# Patient Record
Sex: Female | Born: 2000 | Race: Black or African American | Hispanic: No | Marital: Single | Smoking: Never smoker
Health system: Southern US, Community
[De-identification: ages and names within clinical notes are randomized; demographics above are authoritative.]

## PROBLEM LIST (undated history)

## (undated) DIAGNOSIS — T7840XA Allergy, unspecified, initial encounter: Secondary | ICD-10-CM

## (undated) DIAGNOSIS — M25562 Pain in left knee: Secondary | ICD-10-CM

## (undated) DIAGNOSIS — H7290 Unspecified perforation of tympanic membrane, unspecified ear: Secondary | ICD-10-CM

## (undated) DIAGNOSIS — L309 Dermatitis, unspecified: Secondary | ICD-10-CM

## (undated) HISTORY — PX: TYMPANOSTOMY TUBE PLACEMENT: SHX32

## (undated) HISTORY — DX: Dermatitis, unspecified: L30.9

## (undated) HISTORY — PX: KNEE SURGERY: SHX244

---
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
## 2019-05-26 ENCOUNTER — Other Ambulatory Visit: Payer: Self-pay

## 2019-05-26 ENCOUNTER — Encounter (HOSPITAL_COMMUNITY): Payer: Self-pay | Admitting: Emergency Medicine

## 2019-05-26 ENCOUNTER — Emergency Department (HOSPITAL_COMMUNITY)
Admission: EM | Admit: 2019-05-26 | Discharge: 2019-05-26 | Disposition: A | Discharge status: Home / Self Care | Payer: BLUE CROSS/BLUE SHIELD | Attending: Emergency Medicine | Admitting: Emergency Medicine

## 2019-05-26 DIAGNOSIS — Z79899 Other long term (current) drug therapy: Secondary | ICD-10-CM | POA: Insufficient documentation

## 2019-05-26 DIAGNOSIS — B9689 Other specified bacterial agents as the cause of diseases classified elsewhere: Secondary | ICD-10-CM | POA: Insufficient documentation

## 2019-05-26 DIAGNOSIS — N760 Acute vaginitis: Secondary | ICD-10-CM

## 2019-05-26 LAB — WET PREP, GENITAL
Sperm: NONE SEEN
Trich, Wet Prep: NONE SEEN
Yeast Wet Prep HPF POC: NONE SEEN

## 2019-05-26 LAB — URINALYSIS, ROUTINE W REFLEX MICROSCOPIC
Bilirubin Urine: NEGATIVE
Glucose, UA: NEGATIVE mg/dL
Hgb urine dipstick: NEGATIVE
Ketones, ur: NEGATIVE mg/dL
Nitrite: NEGATIVE
Protein, ur: NEGATIVE mg/dL
Specific Gravity, Urine: 1.018 (ref 1.005–1.030)
pH: 6 (ref 5.0–8.0)

## 2019-05-26 LAB — POC URINE PREG, ED: Preg Test, Ur: NEGATIVE

## 2019-05-26 MED ORDER — ONDANSETRON HCL 4 MG PO TABS
4.0000 mg | ORAL_TABLET | Freq: Once | ORAL | Status: AC
  Administered 2019-05-26: 4 mg via ORAL
  Filled 2019-05-26: qty 1

## 2019-05-26 MED ORDER — METRONIDAZOLE 500 MG PO TABS
500.0000 mg | ORAL_TABLET | Freq: Once | ORAL | Status: AC
  Administered 2019-05-26: 500 mg via ORAL
  Filled 2019-05-26: qty 1

## 2019-05-26 MED ORDER — METRONIDAZOLE 500 MG PO TABS: 500.0000 mg | ORAL_TABLET | Freq: Two times a day (BID) | ORAL | 0 refills | Status: DC

## 2019-05-26 NOTE — ED Provider Notes (Signed)
[facility] Provider [facility]   CSN: [id number] Arrival date & time: 05/26/19  1054     History   Chief Complaint Chief Complaint  Patient presents with  . Vaginal Discharge    HPI Janet lexius Marnette BurgessJ Merle is a 18 y.o. female.     The history is provided by the patient.  Vaginal Discharge Quality:  Yellow, thin and green Severity:  Moderate Onset quality:  Gradual Duration:  5 days Timing:  Intermittent Progression:  Worsening Chronicity:  New Context: at rest   Relieved by:  Nothing Worsened by:  Nothing Ineffective treatments:  None tried Associated symptoms: vaginal itching   Associated symptoms: no abdominal pain, no dysuria, no fever, no nausea and no vomiting   Risk factors: unprotected sex   Risk factors: no foreign body     Past Medical History:  Diagnosis Date  . Allergy   . Left knee pain   . 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 Pain in joint, lower leg 07/06/2012  . Stiffness of joint, not elsewhere classified, lower leg 07/06/2012  . Abnormality of gait 07/06/2012  . Patellar dislocation 07/06/2012    Past Surgical History:  Procedure Laterality Date  . KNEE SURGERY Left   . TYMPANOPLASTY  11/21/2012   Procedure: TYMPANOPLASTY;  Surgeon: Darletta MollSui W Teoh, MD;  Location: [facility];  Service: ENT;  Laterality: Right;  with fascia graft   . TYMPANOSTOMY TUBE PLACEMENT  age 89     OB History    Gravida      Para      Term      Preterm      AB      Living  0     SAB      TAB      Ectopic      Multiple      Live Births               Home Medications    Prior to Admission medications   Medication Sig Start Date End Date Taking? Authorizing Provider  fexofenadine (ALLEGRA) 180 MG tablet Take 180 mg by mouth daily.   Yes [provider]  metroNIDAZOLE (FLAGYL) 500 MG tablet Take 1 tablet (500 mg total) by mouth 2 (two) times  daily. Patient not taking: Reported on 05/26/2019 05/11/18   Lynch AmorIdol, Julie, PA-C  promethazine (PHENERGAN) 25 MG tablet Take 1 tablet (25 mg total) by mouth every 8 (eight) hours as needed for nausea or vomiting (headache). Patient not taking: Reported on 05/26/2019 09/15/17   Samuel JesterMcManus, Kathleen, DO    Family History Family History  Problem Relation Age of Onset  . Cancer Other   . Heart disease Other     Social History Social History   Tobacco Use  . Smoking status: Never Smoker  . Smokeless tobacco: Never Used  Substance Use Topics  . Alcohol use: No  . Drug use: No     Allergies   Patient has no known allergies.   Review of Systems Review of Systems  Constitutional: Negative for activity change, appetite change and fever.  HENT: Negative for congestion, ear discharge, ear pain, facial swelling, nosebleeds, rhinorrhea, sneezing and tinnitus.   Eyes: Negative for photophobia, pain and discharge.  Respiratory: Negative for cough, choking, shortness of breath and wheezing.   Cardiovascular: Negative for chest pain, palpitations and leg swelling.  Gastrointestinal: Negative  for abdominal pain, blood in stool, constipation, diarrhea, nausea and vomiting.  Genitourinary: Positive for vaginal discharge. Negative for difficulty urinating, dysuria, flank pain, frequency and hematuria.  Musculoskeletal: Negative for back pain, gait problem, myalgias and neck pain.  Skin: Negative for color change, rash and wound.  Neurological: Negative for dizziness, seizures, syncope, facial asymmetry, speech difficulty, weakness and numbness.  Hematological: Negative for adenopathy. Does not bruise/bleed easily.  Psychiatric/Behavioral: Negative for agitation, confusion, hallucinations, self-injury and suicidal ideas. The patient is not nervous/anxious.      Physical Exam Updated Vital Signs BP 113/71   Pulse (!) 101   Temp 98.8 F (37.1 C) (Oral)   Resp 18   Ht 5' 5.5" (1.664 m)   Wt 65 kg    LMP 05/03/2019   SpO2 99%   BMI 23.48 kg/m   Physical Exam Vitals signs and nursing note reviewed. Exam conducted with a chaperone present.  Constitutional:      Appearance: She is well-developed. She is not toxic-appearing.  HENT:     Head: Normocephalic.     Right Ear: Tympanic membrane and external ear normal.     Left Ear: Tympanic membrane and external ear normal.  Eyes:     General: Lids are normal.     Pupils: Pupils are equal, round, and reactive to light.  Neck:     Musculoskeletal: Normal range of motion and neck supple.     Vascular: No carotid bruit.  Cardiovascular:     Rate and Rhythm: Normal rate and regular rhythm.     Pulses: Normal pulses.     Heart sounds: Normal heart sounds.  Pulmonary:     Effort: No respiratory distress.     Breath sounds: Normal breath sounds.  Abdominal:     General: Bowel sounds are normal.     Palpations: Abdomen is soft.     Tenderness: There is no abdominal tenderness. There is no guarding.  Genitourinary:    Pubic Area: No rash or pubic lice.      Labia:        Right: No rash, tenderness or lesion.        Left: No rash, tenderness or lesion.      Vagina: No foreign body. Vaginal discharge present. No erythema, tenderness or bleeding.     Cervix: Discharge, friability and erythema present.     Uterus: Not tender and no uterine prolapse.      Adnexa: Right adnexa normal and left adnexa normal.  Musculoskeletal: Normal range of motion.  Lymphadenopathy:     Head:     Right side of head: No submandibular adenopathy.     Left side of head: No submandibular adenopathy.     Cervical: No cervical adenopathy.     Lower Body: No right inguinal adenopathy. No left inguinal adenopathy.  Skin:    General: Skin is warm and dry.  Neurological:     Mental Status: She is alert and oriented to person, place, and time.     Cranial Nerves: No cranial nerve deficit.     Sensory: No sensory deficit.  Psychiatric:        Speech: Speech  normal.      ED Treatments / Results  Labs (all labs ordered are listed, but only abnormal results are displayed) Labs Reviewed  URINALYSIS, ROUTINE W REFLEX MICROSCOPIC - Abnormal; Notable for the following components:      Result Value   APPearance HAZY (*)    Leukocytes,Ua MODERATE (*)  Bacteria, UA RARE (*)    All other components within normal limits  POC URINE PREG, ED    EKG None  Radiology No results found.  Procedures Procedures (including critical care time)  Medications Ordered in ED Medications - No data to display   Initial Impression / Assessment and Plan / ED Course  I have reviewed the triage vital signs and the nursing notes.  Pertinent labs & imaging results that were available during my care of the patient were reviewed by me and considered in my medical decision making (see chart for details).          Final Clinical Impressions(s) / ED Diagnoses MDM  Vital signs are well within normal limits.  Pulse oximetry is 99 to 100% on room air.  Within normal limits by my interpretation.  The patient reports vaginal discharge that seems to be getting worse.  The patient states that she has been practicing unprotected intercourse.  The discharge has become foul-smelling at times.  Patient denies any known foreign body present.  Wet prep shows clue cells present as well as moderate white blood cells.  Urine pregnancy test is negative  Urine analysis shows a hazy yellow specimen with a specific gravity 1.018.  There are moderate leukocyte esterase present, but 0-5 red cells and 0-5 white blood cells.  Patient will be treated with metronidazole.  Have given the patient instructions to make sure she takes this medicine with a meal, and to avoid all alcohol.  I also discussed with the patient the importance of safe sex.  Patient will follow-up with her primary physician or GYN if not improving.   Final diagnoses:  BV (bacterial vaginosis)    ED  Discharge Orders    None       Ivery QualeBryant, Teresa Lemmerman, PA-C 05/26/19 2138    Vanetta MuldersZackowski, Scott, MD 06/03/19 [phone]

## 2019-05-26 NOTE — ED Triage Notes (Signed)
Patient reports foul smelling vaginal discharge x 4-5 days.

## 2019-05-26 NOTE — Discharge Instructions (Addendum)
Your examination and your lab testing are consistent with bacterial vaginitis.  Please use metronidazole 2 times daily with food.  Please do not take this medicine on an empty stomach.  Please refrain from all alcohol, including mouthwash while taking this medication.  Please see Dr. Lanny Cramp for additional evaluation if not improving. A culture for gonorrhea and chlamydia were obtained.  If any of these are positive, someone from our flow managers office will call you with instructions.  For your safety, please practice safe sex.

## 2019-05-28 LAB — RPR: RPR Ser Ql: NONREACTIVE

## 2019-05-30 LAB — GC/CHLAMYDIA PROBE AMP (~~LOC~~) NOT AT ARMC
Chlamydia: POSITIVE — AB
Neisseria Gonorrhea: NEGATIVE

## 2019-05-30 LAB — HIV ANTIBODY (ROUTINE TESTING W REFLEX): HIV Screen 4th Generation wRfx: NONREACTIVE

## 2019-05-31 ENCOUNTER — Telehealth: Payer: Self-pay | Admitting: Medical

## 2019-05-31 DIAGNOSIS — A749 Chlamydial infection, unspecified: Secondary | ICD-10-CM

## 2019-05-31 MED ORDER — AZITHROMYCIN 250 MG PO TABS: 1000.0000 mg | ORAL_TABLET | Freq: Once | ORAL | 0 refills | Status: AC

## 2019-05-31 NOTE — Telephone Encounter (Addendum)
Janet Lynch tested positive for  Chlamydia. Patient was called by RN and allergies and pharmacy confirmed. Rx sent to pharmacy of choice.   Luvenia Redden, PA-C 05/31/2019 11:14 AM      ----- Message from Bjorn Loser, RN sent at 05/31/2019 11:04 AM EDT ----- This patient tested positive for :  chlamydia  She:"has NKDA", I have informed the patient of her results and confirmed her pharmacy is correct in her chart. Please send Rx.   Thank you,   Bjorn Loser, RN   Results faxed to [facility].

## 2019-06-28 DIAGNOSIS — Z113 Encounter for screening for infections with a predominantly sexual mode of transmission: Secondary | ICD-10-CM | POA: Diagnosis not present

## 2019-06-28 DIAGNOSIS — Z01419 Encounter for gynecological examination (general) (routine) without abnormal findings: Secondary | ICD-10-CM | POA: Diagnosis not present

## 2019-06-28 DIAGNOSIS — Z8619 Personal history of other infectious and parasitic diseases: Secondary | ICD-10-CM | POA: Diagnosis not present

## 2019-09-09 ENCOUNTER — Other Ambulatory Visit: Payer: Self-pay

## 2019-09-09 ENCOUNTER — Encounter (HOSPITAL_COMMUNITY): Payer: Self-pay | Admitting: Emergency Medicine

## 2019-09-09 ENCOUNTER — Emergency Department (HOSPITAL_COMMUNITY): Admission: EM | Admit: 2019-09-09 | Discharge: 2019-09-09 | Discharge status: Absent without leave | Payer: Self-pay

## 2019-09-09 ENCOUNTER — Emergency Department (HOSPITAL_COMMUNITY)
Admission: EM | Admit: 2019-09-09 | Discharge: 2019-09-09 | Disposition: A | Discharge status: Home / Self Care | Payer: Self-pay | Attending: Emergency Medicine | Admitting: Emergency Medicine

## 2019-09-09 DIAGNOSIS — N760 Acute vaginitis: Secondary | ICD-10-CM | POA: Diagnosis not present

## 2019-09-09 DIAGNOSIS — Z79899 Other long term (current) drug therapy: Secondary | ICD-10-CM | POA: Insufficient documentation

## 2019-09-09 DIAGNOSIS — N3000 Acute cystitis without hematuria: Secondary | ICD-10-CM | POA: Diagnosis not present

## 2019-09-09 DIAGNOSIS — B9689 Other specified bacterial agents as the cause of diseases classified elsewhere: Secondary | ICD-10-CM | POA: Insufficient documentation

## 2019-09-09 LAB — URINALYSIS, ROUTINE W REFLEX MICROSCOPIC
Bilirubin Urine: NEGATIVE
Glucose, UA: NEGATIVE mg/dL
Hgb urine dipstick: NEGATIVE
Ketones, ur: NEGATIVE mg/dL
Nitrite: NEGATIVE
Protein, ur: NEGATIVE mg/dL
Specific Gravity, Urine: 1.011 (ref 1.005–1.030)
pH: 7 (ref 5.0–8.0)

## 2019-09-09 LAB — WET PREP, GENITAL
Sperm: NONE SEEN
Trich, Wet Prep: NONE SEEN
Yeast Wet Prep HPF POC: NONE SEEN

## 2019-09-09 LAB — PREGNANCY, URINE: Preg Test, Ur: NEGATIVE

## 2019-09-09 MED ORDER — METRONIDAZOLE 0.75 % VA GEL: 1.0000 | Freq: Every day | VAGINAL | 0 refills | Status: AC

## 2019-09-09 MED ORDER — CEPHALEXIN 500 MG PO CAPS: 500.0000 mg | ORAL_CAPSULE | Freq: Two times a day (BID) | ORAL | 0 refills | Status: DC

## 2019-09-09 NOTE — ED Triage Notes (Signed)
Patient complains of vaginal pain and swelling 5 days.

## 2019-09-09 NOTE — Discharge Instructions (Signed)
You were seen in the emergency department today due to vaginal irritation and urinary symptoms.  Your urine looks somewhat infected which we are treating with Keflex, an oral antibiotic.  Your wet prep showed bacterial vaginosis, please see attached handout, we are treating this with intravaginal metronidazole.  We have prescribed you new medication(s) today. Discuss the medications prescribed today with your pharmacist as they can have adverse effects and interactions with your other medicines including over the counter and prescribed medications. Seek medical evaluation if you start to experience new or abnormal symptoms after taking one of these medicines, seek care immediately if you start to experience difficulty breathing, feeling of your throat closing, facial swelling, or rash as these could be indications of a more serious allergic reaction  Please take all medicines as prescribed.  You tested you for gonorrhea, chlamydia, HIV, and syphilis, if any of these results are positive we will call you, if positive you will need to seek treatment and inform all sexual partners.  Please follow-up with OB/GYN within 1 week.  Return to the ER for new or worsening symptoms including but not limited to worsening pain, fever, vomiting, pelvic pain, or any other concerns.

## 2019-09-09 NOTE — ED Provider Notes (Signed)
[facility] EMERGENCY DEPARTMENT Provider Note   CSN: [id number] Arrival date & time: 09/09/19  1802     History   Chief Complaint Chief Complaint  Patient presents with  . Vaginal Pain    HPI Janet Lynch is a 18 y.o. female who presents to the ED w/ complaints of vaginal irritation & urinary sxs for past 3-4 days. Patient states she has pain/swelling & the vaginal introitus that seems to be progressively worsening w/o alleviating/aggravating factors. She has also had some mild dysuria & increased urinary frequency. Started to note discharge today that is white and nonpruritic. Denies fever, chills, N/V, pelvic pain, flank pain, or vaginal bleeding. She is currently sexually active w/ 1 female partner w/o protection.      HPI  Past Medical History:  Diagnosis Date  . Allergy   . Left knee pain   . 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 Pain in joint, lower leg 07/06/2012  . Stiffness of joint, not elsewhere classified, lower leg 07/06/2012  . Abnormality of gait 07/06/2012  . Patellar dislocation 07/06/2012    Past Surgical History:  Procedure Laterality Date  . KNEE SURGERY Left   . TYMPANOPLASTY  11/21/2012   Procedure: TYMPANOPLASTY;  Surgeon: Darletta Moll, MD;  Location: [facility];  Service: ENT;  Laterality: Right;  with fascia graft   . TYMPANOSTOMY TUBE PLACEMENT  age 52     OB History    Gravida      Para      Term      Preterm      AB      Living  0     SAB      TAB      Ectopic      Multiple      Live Births               Home Medications    Prior to Admission medications   Medication Sig Start Date End Date Taking? Authorizing Provider  fexofenadine (ALLEGRA) 180 MG tablet Take 180 mg by mouth daily.    [provider]  metroNIDAZOLE (FLAGYL) 500 MG tablet Take 1 tablet (500 mg total) by mouth 2 (two) times daily. Please take with food  05/26/19   Ivery Quale, PA-C  promethazine (PHENERGAN) 25 MG tablet Take 1 tablet (25 mg total) by mouth every 8 (eight) hours as needed for nausea or vomiting (headache). Patient not taking: Reported on 05/26/2019 09/15/17   Samuel Jester, DO    Family History Family History  Problem Relation Age of Onset  . Cancer Other   . Heart disease Other     Social History Social History   Tobacco Use  . Smoking status: Never Smoker  . Smokeless tobacco: Never Used  Substance Use Topics  . Alcohol use: No  . Drug use: No     Allergies   Patient has no known allergies.   Review of Systems Review of Systems  Constitutional: Negative for chills and fever.  Respiratory: Negative for shortness of breath.   Cardiovascular: Negative for chest pain.  Gastrointestinal: Negative for abdominal pain, nausea and vomiting.  Genitourinary: Positive for dysuria, frequency, vaginal discharge and vaginal pain. Negative for flank pain, hematuria, pelvic pain and vaginal bleeding.  All other systems reviewed and are negative.    Physical Exam Updated Vital Signs BP 109/73 (BP Location: Right  Arm)   Pulse 99   Temp 98.5 F (36.9 C) (Oral)   Resp 18   Ht 5\' 5"  (1.651 m)   Wt 55.8 kg   LMP 08/28/2019   SpO2 100%   BMI 20.47 kg/m   Physical Exam Vitals signs and nursing note reviewed. Exam conducted with a chaperone present.  Constitutional:      General: She is not in acute distress.    Appearance: She is well-developed. She is not toxic-appearing.  HENT:     Head: Normocephalic and atraumatic.  Eyes:     General:        Right eye: No discharge.        Left eye: No discharge.     Conjunctiva/sclera: Conjunctivae normal.  Neck:     Musculoskeletal: Neck supple.  Cardiovascular:     Rate and Rhythm: Normal rate and regular rhythm.  Pulmonary:     Effort: Pulmonary effort is normal. No respiratory distress.     Breath sounds: Normal breath sounds. No wheezing, rhonchi or rales.   Abdominal:     General: There is no distension.     Palpations: Abdomen is soft.     Tenderness: There is no abdominal tenderness. There is no right CVA tenderness, left CVA tenderness, guarding or rebound.  Genitourinary:    Exam position: Supine.     Labia:        Right: No lesion.        Left: No lesion.      Vagina: Vaginal discharge (thick discharge in the vaginal vault- moderate amount) present.     Cervix: No cervical motion tenderness or friability.     Adnexa:        Right: No tenderness.         Left: No tenderness.       Comments: RN Lonn Georgia present as chaperone. Erythema @ the vaginal introitus.   No wounds or lesions.  No abscess.  Skin:    General: Skin is warm and dry.     Findings: No rash.  Neurological:     Mental Status: She is alert.     Comments: Clear speech.   Psychiatric:        Behavior: Behavior normal.      ED Treatments / Results  Labs (all labs ordered are listed, but only abnormal results are displayed) Labs Reviewed  WET PREP, GENITAL - Abnormal; Notable for the following components:      Result Value   Clue Cells Wet Prep HPF POC PRESENT (*)    WBC, Wet Prep HPF POC MANY (*)    All other components within normal limits  URINALYSIS, ROUTINE W REFLEX MICROSCOPIC - Abnormal; Notable for the following components:   APPearance CLOUDY (*)    Leukocytes,Ua LARGE (*)    Bacteria, UA RARE (*)    All other components within normal limits  URINE CULTURE  PREGNANCY, URINE  RPR  HIV ANTIBODY (ROUTINE TESTING W REFLEX)  HIV4GL SAVE TUBE  GC/CHLAMYDIA PROBE AMP ([place]) NOT AT [facility]    EKG None  Radiology No results found.  Procedures Procedures (including critical care time)  Medications Ordered in ED Medications - No data to display   Initial Impression / Assessment and Plan / ED Course  I have reviewed the triage vital signs and the nursing notes.  Pertinent labs & imaging results that were available during my care of the  patient were reviewed by me and considered in my medical decision  making (see chart for details).   Patient presents w/ vaginal/urinary complaints.  Nontoxic appearing, no apparent distress, vitals WNL.  No abdominal, adnexal, or cervical motion tenderness- does not seem consistent w/ PID. STD testing including RPR/HIV/GC/chlamdydia- pending results patient aware if positive will need tx and to inform all sexual partners.  Wet prep w/ BV- will treat with intra-vaginal flagyl, no yeast or trich. UA w/ concern for infection given large leukocytes but this is contaminated w/ squamous epithelial cells, given her urinary sxs will tx w/ keflex & send for culture. I discussed results, treatment plan, need for follow-up, and return precautions with the patient. Provided opportunity for questions, patient confirmed understanding and is in agreement with plan.     Final Clinical Impressions(s) / ED Diagnoses   Final diagnoses:  Bacterial vaginosis  Acute cystitis without hematuria    ED Discharge Orders         Ordered    cephALEXin (KEFLEX) 500 MG capsule  2 times daily     09/09/19 2022    metroNIDAZOLE (METROGEL VAGINAL) 0.75 % vaginal gel  Daily at bedtime     09/09/19 2022           Cherly Andersonetrucelli, Keir Foland R, PA-C 09/09/19 2025    Vanetta MuldersZackowski, Scott, MD 09/19/19 [id number]

## 2019-09-10 LAB — HIV ANTIBODY (ROUTINE TESTING W REFLEX): HIV Screen 4th Generation wRfx: NONREACTIVE

## 2019-09-10 LAB — RPR: RPR Ser Ql: NONREACTIVE

## 2019-09-11 LAB — URINE CULTURE: Culture: <10000 — AB

## 2019-09-12 LAB — GC/CHLAMYDIA PROBE AMP (~~LOC~~) NOT AT ARMC
Chlamydia: NEGATIVE
Neisseria Gonorrhea: NEGATIVE

## 2019-09-18 ENCOUNTER — Telehealth: Payer: Self-pay | Admitting: *Deleted

## 2019-09-18 NOTE — Telephone Encounter (Signed)
Returned patient's call.  Informed that since she has never been seen in our office before, she would be considered a new patient and would need an inoffice visit.  I did see that she has previously been seen at [facility] in [place] and may want to try there to see if they could see her sooner but could call our office back if she wanted to be seen here.

## 2019-09-18 NOTE — Telephone Encounter (Signed)
Patient states she was seen in the ED and was told to f/u with Dr.  Roney Jaffe she is worse.

## 2019-09-19 ENCOUNTER — Emergency Department (HOSPITAL_COMMUNITY)
Admission: EM | Admit: 2019-09-19 | Discharge: 2019-09-19 | Disposition: A | Discharge status: Home / Self Care | Payer: Self-pay | Attending: Emergency Medicine | Admitting: Emergency Medicine

## 2019-09-19 ENCOUNTER — Other Ambulatory Visit: Payer: Self-pay

## 2019-09-19 ENCOUNTER — Encounter (HOSPITAL_COMMUNITY): Payer: Self-pay

## 2019-09-19 DIAGNOSIS — Z79899 Other long term (current) drug therapy: Secondary | ICD-10-CM | POA: Insufficient documentation

## 2019-09-19 DIAGNOSIS — N760 Acute vaginitis: Secondary | ICD-10-CM | POA: Insufficient documentation

## 2019-09-19 LAB — WET PREP, GENITAL
Clue Cells Wet Prep HPF POC: NONE SEEN
Sperm: NONE SEEN
Trich, Wet Prep: NONE SEEN
Yeast Wet Prep HPF POC: NONE SEEN

## 2019-09-19 LAB — URINALYSIS, ROUTINE W REFLEX MICROSCOPIC
Bilirubin Urine: NEGATIVE
Glucose, UA: NEGATIVE mg/dL
Hgb urine dipstick: NEGATIVE
Ketones, ur: NEGATIVE mg/dL
Nitrite: NEGATIVE
Protein, ur: NEGATIVE mg/dL
Specific Gravity, Urine: 1.016 (ref 1.005–1.030)
pH: 6 (ref 5.0–8.0)

## 2019-09-19 LAB — POC URINE PREG, ED: Preg Test, Ur: NEGATIVE

## 2019-09-19 NOTE — ED Provider Notes (Signed)
[facility] EMERGENCY DEPARTMENT Provider Note   CSN: [id number] Arrival date & time: 09/19/19  1143     History   Chief Complaint Chief Complaint  Patient presents with  . Vaginal Pain    HPI Janet Lynch is a 18 y.o. female with no significant past medical history, was treated here 10 days ago for bacterial vaginosis, completed the 5 day course of medication but her vaginal pain seems worse as well as increased vaginal discharge described as thick and green. She denies fevers, chills, n/v or abd/pelvic pain.  She reports walking and sitting causes increased pain and has sensation her vagina is swollen.  She is sexually active with one partner and she recently discovered he was applying cocoa butter to his penis prior to intercourse.  Additionally, states she switched from using vagisil to [employer] vaginal wash, but states she switched to this after her symptoms began.   She has found no alleviators for her symptoms.     HPI  Past Medical History:  Diagnosis Date  . Allergy   . Left knee pain   . 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 Pain in joint, lower leg 07/06/2012  . Stiffness of joint, not elsewhere classified, lower leg 07/06/2012  . Abnormality of gait 07/06/2012  . Patellar dislocation 07/06/2012    Past Surgical History:  Procedure Laterality Date  . KNEE SURGERY Left   . TYMPANOPLASTY  11/21/2012   Procedure: TYMPANOPLASTY;  Surgeon: Ascencion Dike, MD;  Location: [place];  Service: ENT;  Laterality: Right;  with fascia graft   . TYMPANOSTOMY TUBE PLACEMENT  age 8     OB History    Gravida      Para      Term      Preterm      AB      Living  0     SAB      TAB      Ectopic      Multiple      Live Births               Home Medications    Prior to Admission medications   Medication Sig Start Date End Date Taking? Authorizing Provider   cephALEXin (KEFLEX) 500 MG capsule Take 1 capsule (500 mg total) by mouth 2 (two) times daily. 09/09/19   Petrucelli, Samantha R, PA-C  fexofenadine (ALLEGRA) 180 MG tablet Take 180 mg by mouth daily.    [provider]  metroNIDAZOLE (FLAGYL) 500 MG tablet Take 1 tablet (500 mg total) by mouth 2 (two) times daily. Please take with food 05/26/19   Lily Kocher, PA-C    Family History Family History  Problem Relation Age of Onset  . Cancer Other   . Heart disease Other     Social History Social History   Tobacco Use  . Smoking status: Never Smoker  . Smokeless tobacco: Never Used  Substance Use Topics  . Alcohol use: No  . Drug use: No     Allergies   Patient has no known allergies.   Review of Systems Review of Systems  Constitutional: Negative for chills and fever.  HENT: Negative for congestion and sore throat.   Eyes: Negative.   Respiratory: Negative for chest tightness and shortness of breath.   Cardiovascular: Negative for chest pain.  Gastrointestinal: Negative for abdominal pain, nausea  and vomiting.  Genitourinary: Positive for vaginal discharge and vaginal pain. Negative for dysuria, flank pain and pelvic pain.  Musculoskeletal: Negative for arthralgias, joint swelling and neck pain.  Skin: Negative.  Negative for rash and wound.  Neurological: Negative for dizziness, weakness, light-headedness, numbness and headaches.  Psychiatric/Behavioral: Negative.      Physical Exam Updated Vital Signs BP 116/76 (BP Location: Right Arm)   Pulse 82   Temp 98.8 F (37.1 C) (Oral)   Resp 16   Ht 5\' 5"  (1.651 m)   Wt 55.8 kg   LMP 08/28/2019   SpO2 100%   BMI 20.47 kg/m   Physical Exam Vitals signs and nursing note reviewed. Exam conducted with a chaperone present.  Constitutional:      Appearance: She is well-developed.  HENT:     Head: Normocephalic and atraumatic.  Eyes:     Conjunctiva/sclera: Conjunctivae normal.  Neck:     Musculoskeletal:  Normal range of motion.  Cardiovascular:     Rate and Rhythm: Normal rate and regular rhythm.     Heart sounds: Normal heart sounds.  Pulmonary:     Effort: Pulmonary effort is normal.     Breath sounds: Normal breath sounds. No wheezing.  Abdominal:     General: Bowel sounds are normal.     Palpations: Abdomen is soft.     Tenderness: There is no abdominal tenderness.  Genitourinary:    Vagina: Erythema present.     Cervix: Erythema present. No cervical motion tenderness or discharge.     Uterus: Normal. Not tender.      Adnexa: Right adnexa normal and left adnexa normal.     Comments: Thick, copious green tinged vaginal discharge.   Musculoskeletal: Normal range of motion.  Skin:    General: Skin is warm and dry.  Neurological:     Mental Status: She is alert.      ED Treatments / Results  Labs (all labs ordered are listed, but only abnormal results are displayed) Labs Reviewed  WET PREP, GENITAL - Abnormal; Notable for the following components:      Result Value   WBC, Wet Prep HPF POC TOO NUMEROUS TO COUNT (*)    All other components within normal limits  URINALYSIS, ROUTINE W REFLEX MICROSCOPIC - Abnormal; Notable for the following components:   APPearance HAZY (*)    Leukocytes,Ua LARGE (*)    Bacteria, UA RARE (*)    All other components within normal limits  POC URINE PREG, ED  GC/CHLAMYDIA PROBE AMP ([place]) NOT AT [facility]    EKG None  Radiology No results found.  Procedures Procedures (including critical care time)  Medications Ordered in ED Medications - No data to display   Initial Impression / Assessment and Plan / ED Course  I have reviewed the triage vital signs and the nursing notes.  Pertinent labs & imaging results that were available during my care of the patient were reviewed by me and considered in my medical decision making (see chart for details).        Patient with significant vaginal wall erythema and copious discharge.  She  has no cervical motion tenderness or adnexal/uterus pain.  Her GC chlamydia and HIV/RPR cultures from last week visit were negative.  Her GC chlamydia will be collected today, although I doubt this represents cervicitis or PID.  She has had multiple potential vaginal exposures including [facility] product, cocoa butter and the Metro[employer]el itself which may have exacerbated vaginal mucosal  inflammation.  [facility] have [facility].  Encouraged to keep this appointment.  Advised to avoid these products as either one of them may be causing her symptoms.  Advised she could try Vagisil antiitch cream which contains hydrocortisone and may help with inflammatory symptoms.  Final Clinical Impressions(s) / ED Diagnoses   Final diagnoses:  Acute vaginitis    ED Discharge Orders    None       Victoriano Laindol, Nigil Braman, PA-C 09/19/19 2335    Geoffery Lyonselo, Douglas, MD 09/20/19 1500

## 2019-09-19 NOTE — Discharge Instructions (Addendum)
You do not have any sign of infection today. I suspect your vaginal mucosa is irritated from the cocoa butter you were exposed to.  You may try a product such as vagisil anti itch creme which contains hydrocortisone - this can help with your symptoms.

## 2019-09-19 NOTE — ED Triage Notes (Signed)
Pt presents to ED with vaginal pain and swelling. Pt states she had a problem sitting down last night. Pt states she was tested for STD and all of those come back negative.

## 2019-09-21 LAB — GC/CHLAMYDIA PROBE AMP (~~LOC~~) NOT AT ARMC
Chlamydia: NEGATIVE
Neisseria Gonorrhea: NEGATIVE

## 2019-09-26 ENCOUNTER — Encounter: Payer: Self-pay | Admitting: Women's Health

## 2019-09-26 ENCOUNTER — Other Ambulatory Visit: Payer: Self-pay

## 2019-09-27 NOTE — Progress Notes (Signed)
Appt was cancelled by pt This encounter was created in error - please disregard.

## 2019-10-02 ENCOUNTER — Other Ambulatory Visit: Payer: Self-pay

## 2019-10-02 ENCOUNTER — Encounter: Payer: Self-pay | Admitting: Women's Health

## 2019-10-03 ENCOUNTER — Other Ambulatory Visit: Payer: Self-pay

## 2019-10-03 ENCOUNTER — Emergency Department (HOSPITAL_COMMUNITY)
Admission: EM | Admit: 2019-10-03 | Discharge: 2019-10-03 | Disposition: A | Discharge status: Home / Self Care | Payer: Self-pay | Attending: Emergency Medicine | Admitting: Emergency Medicine

## 2019-10-03 ENCOUNTER — Encounter (HOSPITAL_COMMUNITY): Payer: Self-pay

## 2019-10-03 DIAGNOSIS — N760 Acute vaginitis: Secondary | ICD-10-CM | POA: Insufficient documentation

## 2019-10-03 DIAGNOSIS — Z79899 Other long term (current) drug therapy: Secondary | ICD-10-CM | POA: Insufficient documentation

## 2019-10-03 LAB — WET PREP, GENITAL
Clue Cells Wet Prep HPF POC: NONE SEEN
Sperm: NONE SEEN
Trich, Wet Prep: NONE SEEN
Yeast Wet Prep HPF POC: NONE SEEN

## 2019-10-03 MED ORDER — FLUCONAZOLE 150 MG PO TABS
150.0000 mg | ORAL_TABLET | Freq: Once | ORAL | Status: AC
  Administered 2019-10-03: 13:00:00 150 mg via ORAL
  Filled 2019-10-03: qty 1

## 2019-10-03 NOTE — ED Provider Notes (Signed)
[facility] EMERGENCY DEPARTMENT Provider Note   CSN: [id number] Arrival date & time: 10/03/19  0746     History   Chief Complaint Chief Complaint  Patient presents with  . Abscess    HPI Janet Lynch is a 18 y.o. female w PMHx BV, presenting to the ED with compaint of vaginal pain. She was evaluated when sx began 1 month ago and treated with flagyl for BV. She was then seen again after increased discomfort, and was discharged with outpt follow up and symptomatic management. She has a "knot" at the "entrance" to her vaginal, pain with intercourse. Denies vaginal discharge. She reports she has been unable to see gynecology because they keep rescheduling her visit.  She has had unprotected intercourse since her last ED visit, however with the same partner.  She has had GC/chlamydia cultures done twice recently and are negative.  She also had a recent negative pregnancy test on 09/19/2019 and began her menstrual period on 09/25/2019.     The history is provided by the patient and medical records.    Past Medical History:  Diagnosis Date  . Allergy   . Left knee pain   . 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 Pain in joint, lower leg 07/06/2012  . Stiffness of joint, not elsewhere classified, lower leg 07/06/2012  . Abnormality of gait 07/06/2012  . Patellar dislocation 07/06/2012    Past Surgical History:  Procedure Laterality Date  . KNEE SURGERY Left   . TYMPANOPLASTY  11/21/2012   Procedure: TYMPANOPLASTY;  Surgeon: Darletta Moll, MD;  Location: [facility];  Service: ENT;  Laterality: Right;  with fascia graft   . TYMPANOSTOMY TUBE PLACEMENT  age 44     OB History    Gravida      Para      Term      Preterm      AB      Living  0     SAB      TAB      Ectopic      Multiple      Live Births               Home Medications    Prior to Admission  medications   Medication Sig Start Date End Date Taking? Authorizing Provider  cephALEXin (KEFLEX) 500 MG capsule Take 1 capsule (500 mg total) by mouth 2 (two) times daily. 09/09/19   Petrucelli, Samantha R, PA-C  fexofenadine (ALLEGRA) 180 MG tablet Take 180 mg by mouth daily.    [provider]  metroNIDAZOLE (FLAGYL) 500 MG tablet Take 1 tablet (500 mg total) by mouth 2 (two) times daily. Please take with food 05/26/19   Ivery Quale, PA-C    Family History Family History  Problem Relation Age of Onset  . Cancer Other   . Heart disease Other     Social History Social History   Tobacco Use  . Smoking status: Never Smoker  . Smokeless tobacco: Never Used  Substance Use Topics  . Alcohol use: No  . Drug use: No     Allergies   Patient has no known allergies.   Review of Systems Review of Systems  All other systems reviewed and are negative.    Physical Exam Updated Vital Signs BP 113/80   Pulse 80   Temp 98.4 F (36.9 C) (Oral)  Resp 16   SpO2 97%   Physical Exam Vitals signs and nursing note reviewed.  Constitutional:      General: She is not in acute distress.    Appearance: She is well-developed. She is not ill-appearing.  HENT:     Head: Normocephalic and atraumatic.  Eyes:     Conjunctiva/sclera: Conjunctivae normal.  Cardiovascular:     Rate and Rhythm: Normal rate and regular rhythm.  Pulmonary:     Effort: Pulmonary effort is normal. No respiratory distress.     Breath sounds: Normal breath sounds.  Abdominal:     General: Bowel sounds are normal.     Palpations: Abdomen is soft.     Tenderness: There is no abdominal tenderness. There is no guarding or rebound.  Genitourinary:    Comments: Exam performed with female RN chaperone present.  There is thick  white adherent discharge to the vaginal walls with vaginal erythema present.  Patient's concern for "knot" appears to be urethral meatus with very mild inflammation.  No tenderness on  bimanual exam. Skin:    General: Skin is warm.  Neurological:     Mental Status: She is alert.  Psychiatric:        Behavior: Behavior normal.      ED Treatments / Results  Labs (all labs ordered are listed, but only abnormal results are displayed) Labs Reviewed  WET PREP, GENITAL - Abnormal; Notable for the following components:      Result Value   WBC, Wet Prep HPF POC MANY (*)    All other components within normal limits    EKG None  Radiology No results found.  Procedures Procedures (including critical care time)  Medications Ordered in ED Medications  fluconazole (DIFLUCAN) tablet 150 mg (has no administration in time range)     Initial Impression / Assessment and Plan / ED Course  I have reviewed the triage vital signs and the nursing notes.  Pertinent labs & imaging results that were available during my care of the patient were reviewed by me and considered in my medical decision making (see chart for details).        Patient presenting with vaginal irritation after recently treated for BV last month.  Also recently finished menstrual period.  No abdominal pain or urinary symptoms.  Patient was concerned about a "knot" just inside of her vagina, however this appears to be patient's urethral meatus for which she was concerned about with very mild inflammation.  There is also vaginal wall erythema with white adherent discharge present clinically consistent with yeast infection.  Will treat for clinical presentation with Diflucan and encouraged outpatient follow-up.  Shared decision making with patient regarding repeat STD cultures, however she has had a recent negative tests and has had no new exposure.  Also recent negative pregnancy test followed by a normal menstrual period.  Patient safe for discharge.  Final Clinical Impressions(s) / ED Diagnoses   Final diagnoses:  Acute vaginitis    ED Discharge Orders    None       Taisley Mordan, [place] N, PA-C  10/03/19 1215    Charlesetta Shanks, MD 10/04/19 1454

## 2019-10-03 NOTE — ED Triage Notes (Signed)
Pt presents with vaginal "knot" x3 weeks.

## 2019-10-05 ENCOUNTER — Encounter: Payer: Self-pay | Admitting: Women's Health

## 2019-11-02 DIAGNOSIS — N898 Other specified noninflammatory disorders of vagina: Secondary | ICD-10-CM | POA: Diagnosis not present

## 2019-11-02 DIAGNOSIS — R350 Frequency of micturition: Secondary | ICD-10-CM | POA: Diagnosis not present

## 2019-11-02 DIAGNOSIS — Z113 Encounter for screening for infections with a predominantly sexual mode of transmission: Secondary | ICD-10-CM | POA: Diagnosis not present

## 2019-11-14 ENCOUNTER — Other Ambulatory Visit: Payer: Self-pay

## 2019-11-14 ENCOUNTER — Ambulatory Visit: Payer: Self-pay | Attending: Internal Medicine

## 2019-11-14 DIAGNOSIS — Z20822 Contact with and (suspected) exposure to covid-19: Secondary | ICD-10-CM

## 2019-11-14 DIAGNOSIS — Z20828 Contact with and (suspected) exposure to other viral communicable diseases: Secondary | ICD-10-CM | POA: Insufficient documentation

## 2019-11-15 LAB — NOVEL CORONAVIRUS, NAA: SARS-CoV-2, NAA: NOT DETECTED

## 2019-12-01 ENCOUNTER — Ambulatory Visit: Payer: Self-pay | Attending: Internal Medicine

## 2019-12-01 ENCOUNTER — Other Ambulatory Visit: Payer: Self-pay

## 2019-12-01 DIAGNOSIS — Z20822 Contact with and (suspected) exposure to covid-19: Secondary | ICD-10-CM | POA: Insufficient documentation

## 2019-12-02 LAB — NOVEL CORONAVIRUS, NAA: SARS-CoV-2, NAA: NOT DETECTED

## 2019-12-14 ENCOUNTER — Ambulatory Visit: Payer: Self-pay | Attending: Internal Medicine

## 2019-12-14 ENCOUNTER — Other Ambulatory Visit: Payer: Self-pay

## 2019-12-14 DIAGNOSIS — Z20822 Contact with and (suspected) exposure to covid-19: Secondary | ICD-10-CM | POA: Insufficient documentation

## 2019-12-15 LAB — NOVEL CORONAVIRUS, NAA: SARS-CoV-2, NAA: NOT DETECTED

## 2020-10-28 ENCOUNTER — Encounter (HOSPITAL_COMMUNITY): Payer: Self-pay

## 2020-10-28 ENCOUNTER — Other Ambulatory Visit: Payer: Self-pay

## 2020-10-28 DIAGNOSIS — E876 Hypokalemia: Secondary | ICD-10-CM | POA: Insufficient documentation

## 2020-10-28 DIAGNOSIS — M25512 Pain in left shoulder: Secondary | ICD-10-CM | POA: Insufficient documentation

## 2020-10-28 DIAGNOSIS — R202 Paresthesia of skin: Secondary | ICD-10-CM | POA: Insufficient documentation

## 2020-10-28 NOTE — ED Triage Notes (Signed)
Patient arrived stating tonight after she ate she felt "like the blood was rushing to her fingertips" and has had left shoulder pain 3 weeks. Reports when standing sometimes her "legs feel prickly" today felt lightheaded.

## 2020-10-29 ENCOUNTER — Emergency Department (HOSPITAL_COMMUNITY)
Admission: EM | Admit: 2020-10-29 | Discharge: 2020-10-29 | Disposition: A | Discharge status: Home / Self Care | Payer: BC Managed Care – PPO | Attending: Emergency Medicine | Admitting: Emergency Medicine

## 2020-10-29 DIAGNOSIS — M25512 Pain in left shoulder: Secondary | ICD-10-CM

## 2020-10-29 DIAGNOSIS — R202 Paresthesia of skin: Secondary | ICD-10-CM

## 2020-10-29 DIAGNOSIS — E876 Hypokalemia: Secondary | ICD-10-CM

## 2020-10-29 LAB — URINALYSIS, ROUTINE W REFLEX MICROSCOPIC
Bilirubin Urine: NEGATIVE
Glucose, UA: NEGATIVE mg/dL
Hgb urine dipstick: NEGATIVE
Ketones, ur: NEGATIVE mg/dL
Nitrite: NEGATIVE
Protein, ur: NEGATIVE mg/dL
Specific Gravity, Urine: 1.006 (ref 1.005–1.030)
pH: 6 (ref 5.0–8.0)

## 2020-10-29 LAB — BASIC METABOLIC PANEL
Anion gap: 8 (ref 5–15)
BUN: 13 mg/dL (ref 6–20)
CO2: 23 mmol/L (ref 22–32)
Calcium: 8.8 mg/dL — ABNORMAL LOW (ref 8.9–10.3)
Chloride: 104 mmol/L (ref 98–111)
Creatinine, Ser: 0.73 mg/dL (ref 0.44–1.00)
GFR, Estimated: >60 mL/min (ref >60)
Glucose, Bld: 91 mg/dL (ref 70–99)
Potassium: 3.3 mmol/L — ABNORMAL LOW (ref 3.5–5.1)
Sodium: 135 mmol/L (ref 135–145)

## 2020-10-29 LAB — CBC
HCT: 36.4 % (ref 36.0–46.0)
Hemoglobin: 12.2 g/dL (ref 12.0–15.0)
MCH: 33.4 pg (ref 26.0–34.0)
MCHC: 33.5 g/dL (ref 30.0–36.0)
MCV: 99.7 fL (ref 80.0–100.0)
Platelets: 267 10*3/uL (ref 150–400)
RBC: 3.65 MIL/uL — ABNORMAL LOW (ref 3.87–5.11)
RDW: 12.4 % (ref 11.5–15.5)
WBC: 5.1 10*3/uL (ref 4.0–10.5)
nRBC: 0 % (ref 0.0–0.2)

## 2020-10-29 LAB — I-STAT BETA HCG BLOOD, ED (MC, WL, AP ONLY): I-stat hCG, quantitative: <5 m[IU]/mL (ref <5)

## 2020-10-29 LAB — CBG MONITORING, ED: Glucose-Capillary: 89 mg/dL (ref 70–99)

## 2020-10-29 MED ORDER — POTASSIUM CHLORIDE CRYS ER 20 MEQ PO TBCR
40.0000 meq | EXTENDED_RELEASE_TABLET | Freq: Once | ORAL | Status: AC
  Administered 2020-10-29: 40 meq via ORAL
  Filled 2020-10-29: qty 2

## 2020-10-29 NOTE — ED Provider Notes (Signed)
[facility]-EMERGENCY DEPT Provider Note   CSN: [id number] Arrival date & time: 10/28/20  2335     History Chief Complaint  Patient presents with  . Dizziness    Janet Lynch is a 19 y.o. female.  HPI     This is a 19 year old female with no reported past medical history who presents with several complaints.  She states that she has had a 3-week history of worsening left shoulder pain.  It is over the lateral shoulder.  She denies any injury or heavy lifting.  She states that she has taken ibuprofen with some relief.  Currently her pain is 1 out of 10.  Is worse with range of motion.  Additionally, she reports that she had an event prior to arrival where she "felt like all the blood was rushing to my hands."  She senses reported aches in the bilateral upper and lower extremities.  Again rates her pain around 1-2 out of 10.  Does not take anything for the pain.  Denies heavy lifting or heavy workouts.  Denies chest pain, shortness of breath, fevers, any systemic symptoms.  Past Medical History:  Diagnosis Date  . Allergy   . Left knee pain   . 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 Pain in joint, lower leg 07/06/2012  . Stiffness of joint, not elsewhere classified, lower leg 07/06/2012  . Abnormality of gait 07/06/2012  . Patellar dislocation 07/06/2012    Past Surgical History:  Procedure Laterality Date  . KNEE SURGERY Left   . TYMPANOPLASTY  11/21/2012   Procedure: TYMPANOPLASTY;  Surgeon: Darletta Moll, MD;  Location: [facility];  Service: ENT;  Laterality: Right;  with fascia graft   . TYMPANOSTOMY TUBE PLACEMENT  age 21     OB History    Gravida      Para      Term      Preterm      AB      Living  0     SAB      TAB      Ectopic      Multiple      Live Births              Family History  Problem Relation Age of Onset  . Cancer Other     . Heart disease Other     Social History   Tobacco Use  . Smoking status: Never Smoker  . Smokeless tobacco: Never Used  Vaping Use  . Vaping Use: Never used  Substance Use Topics  . Alcohol use: No  . Drug use: No    Home Medications Prior to Admission medications   Medication Sig Start Date End Date Taking? Authorizing Provider  cephALEXin (KEFLEX) 500 MG capsule Take 1 capsule (500 mg total) by mouth 2 (two) times daily. 09/09/19   Petrucelli, Samantha R, PA-C  fexofenadine (ALLEGRA) 180 MG tablet Take 180 mg by mouth daily.    [provider]  metroNIDAZOLE (FLAGYL) 500 MG tablet Take 1 tablet (500 mg total) by mouth 2 (two) times daily. Please take with food 05/26/19   Ivery Quale, PA-C    Allergies    Patient has no known allergies.  Review of Systems   Review of Systems  Constitutional: Negative for fever.  Respiratory: Negative for shortness of breath.   Cardiovascular: Negative for chest pain.  Gastrointestinal: Negative for abdominal pain, nausea and vomiting.  Musculoskeletal: Positive for myalgias.  Neurological:       Paresthesias  All other systems reviewed and are negative.   Physical Exam Updated Vital Signs BP 114/82 (BP Location: Left Arm)   Pulse 66   Temp 99 F (37.2 C) (Oral)   Resp 16   LMP 10/23/2020   SpO2 100%   Physical Exam Vitals and nursing note reviewed.  Constitutional:      Appearance: She is well-developed.  HENT:     Head: Normocephalic and atraumatic.     Nose: Nose normal.     Mouth/Throat:     Mouth: Mucous membranes are moist.  Eyes:     Pupils: Pupils are equal, round, and reactive to light.  Cardiovascular:     Rate and Rhythm: Normal rate and regular rhythm.     Heart sounds: Normal heart sounds.  Pulmonary:     Effort: Pulmonary effort is normal. No respiratory distress.     Breath sounds: No wheezing.  Abdominal:     Palpations: Abdomen is soft.     Tenderness: There is no abdominal tenderness.   Musculoskeletal:     Cervical back: Neck supple.     Comments: Tenderness to palpation left arm over the deltoid, no fluctuance, no swelling, no overlying skin changes, normal range of motion of the left shoulder, 2+ distal radial pulse  Skin:    General: Skin is warm and dry.  Neurological:     Mental Status: She is alert and oriented to person, place, and time.  Psychiatric:        Mood and Affect: Mood normal.     ED Results / Procedures / Treatments   Labs (all labs ordered are listed, but only abnormal results are displayed) Labs Reviewed  BASIC METABOLIC PANEL - Abnormal; Notable for the following components:      Result Value   Potassium 3.3 (*)    Calcium 8.8 (*)    All other components within normal limits  CBC - Abnormal; Notable for the following components:   RBC 3.65 (*)    All other components within normal limits  URINALYSIS, ROUTINE W REFLEX MICROSCOPIC - Abnormal; Notable for the following components:   APPearance HAZY (*)    Leukocytes,Ua TRACE (*)    Bacteria, UA FEW (*)    All other components within normal limits  CBG MONITORING, ED  I-STAT BETA HCG BLOOD, ED ([facility], [facility], [facility] ONLY)    EKG None  Radiology No results found.  Procedures Procedures (including critical care time)  Medications Ordered in ED Medications  potassium chloride SA (KLOR-CON) CR tablet 40 mEq (has no administration in time range)    ED Course  I have reviewed the triage vital signs and the nursing notes.  Pertinent labs & imaging results that were available during my care of the patient were reviewed by me and considered in my medical decision making (see chart for details).    MDM Rules/Calculators/A&P                          Patient presents with several complaints.  Shoulder pain appears acute on chronic.  She is overall nontoxic and vital signs are reassuring.  Regarding shoulder pain, she has normal range of motion and denies injury.  No bony tenderness but more  tenderness over the muscle of the deltoid.  No overlying skin changes to suggest cellulitis.  Low  suspicion for fracture.  Recommend ice and anti-inflammatories.  Additionally, she reports paresthesias in body aches of the upper and lower extremities.  No objective abnormal findings.  Lab work reviewed.  She does have slight hypokalemia which can sometimes cause muscle aches.  She was given oral potassium.  No indication for obtaining CT as I have low suspicion for rhabdomyolysis.  Other work-up is reassuring.  Patient reassured.  Recommend increasing potassium rich foods in her diet.  Follow-up with primary physician  After history, exam, and medical workup I feel the patient has been appropriately medically screened and is safe for discharge home. Pertinent diagnoses were discussed with the patient. Patient was given return precautions.  Final Clinical Impression(s) / ED Diagnoses Final diagnoses:  Left shoulder pain, unspecified chronicity  Paresthesia  Hypokalemia    Rx / DC Orders ED Discharge Orders    None       Shon Baton, MD 10/29/20 [phone]

## 2020-10-29 NOTE — Discharge Instructions (Addendum)
You were seen today for tingling in your extremities and left shoulder pain.  Your work-up is largely reassuring.  You did have slightly low potassium at 3.3.  Increase potassium rich foods in your diet.  Sometimes this can cause tingling and body aches.  Apply ice to the left arm.  Take naproxen as needed for pain.

## 2020-12-10 ENCOUNTER — Emergency Department (HOSPITAL_COMMUNITY)
Admission: EM | Admit: 2020-12-10 | Discharge: 2020-12-10 | Disposition: A | Discharge status: Home / Self Care | Payer: BC Managed Care – PPO | Attending: Emergency Medicine | Admitting: Emergency Medicine

## 2020-12-10 ENCOUNTER — Encounter (HOSPITAL_COMMUNITY): Payer: Self-pay | Admitting: *Deleted

## 2020-12-10 ENCOUNTER — Other Ambulatory Visit: Payer: Self-pay

## 2020-12-10 DIAGNOSIS — M25512 Pain in left shoulder: Secondary | ICD-10-CM | POA: Diagnosis not present

## 2020-12-10 DIAGNOSIS — N6321 Unspecified lump in the left breast, upper outer quadrant: Secondary | ICD-10-CM | POA: Diagnosis not present

## 2020-12-10 DIAGNOSIS — N6323 Unspecified lump in the left breast, lower outer quadrant: Secondary | ICD-10-CM | POA: Insufficient documentation

## 2020-12-10 DIAGNOSIS — N63 Unspecified lump in unspecified breast: Secondary | ICD-10-CM

## 2020-12-10 DIAGNOSIS — K219 Gastro-esophageal reflux disease without esophagitis: Secondary | ICD-10-CM | POA: Insufficient documentation

## 2020-12-10 DIAGNOSIS — G8929 Other chronic pain: Secondary | ICD-10-CM

## 2020-12-10 MED ORDER — PANTOPRAZOLE SODIUM 20 MG PO TBEC: 20.0000 mg | DELAYED_RELEASE_TABLET | Freq: Every day | ORAL | 0 refills | Status: DC

## 2020-12-10 MED ORDER — SUCRALFATE 1 G PO TABS: 1.0000 g | ORAL_TABLET | Freq: Three times a day (TID) | ORAL | 0 refills | Status: DC

## 2020-12-10 MED ORDER — LIDOCAINE 5 % EX PTCH: 1.0000 | MEDICATED_PATCH | CUTANEOUS | 0 refills | Status: DC

## 2020-12-10 MED ORDER — NAPROXEN 500 MG PO TABS: 500.0000 mg | ORAL_TABLET | Freq: Two times a day (BID) | ORAL | 0 refills | Status: DC

## 2020-12-10 NOTE — ED Triage Notes (Signed)
Pt felt a knot to left breast for a year, pain to left breast with deep breath is getting worse, unable to lay on her breast as well.  Pt states she has a lump to mid upper abd and feels gassy per pt.

## 2020-12-10 NOTE — Discharge Instructions (Signed)
Follow-up with OB/GYN for breast mass I have placed referral.  You may also follow-up with your previous OB/GYN in [place].  Take the prescription for naproxen which I have written.  Place heat to area.  Follow-up with orthopedics for left shoulder pain.  I have written for lidocaine patches.  Place 1 to your left shoulder.  Leave on for 12 hours and remove for 12 hours.  Must be patch free for 12 hours before you can place additional patch.  Follow up with primary care provider who may also help coordinate your referrals.  Take the Protonix as well as the Carafate for your reflux.  Return for new or worsening symptoms.

## 2020-12-10 NOTE — ED Provider Notes (Signed)
[facility] EMERGENCY DEPARTMENT Provider Note   CSN: [id number] Arrival date & time: 12/10/20  1022    History Chief Complaint  Patient presents with  . Breast Mass    Janet Lynch is a 20 y.o. female with recent past medical history who presents for evaluation of multiple complaints.  #1 left breast pain.  Patient states she has had left breast pain x1 year.  Feels like this is in the outer upper and lower quadrants of her breast.  States she occasionally will feel "masses."  States these come and go.  States typically around her cycle she is unable to lay on her breast due to the pain.  She has not noticed this in her right breast.  She denies any family history of breast cancer at early age.  She has not noted any changes to her nipples, discharge, changes in the skin on her breast.  Has not noticed any redness, warmth to her breast.  Is not take anything for the pain. Previous seen at [facility]. Looking to get ObGyn in [place] where she stays for school  #2 left shoulder pain.  Patient states she has had left shoulder pain x3 months.  No recent injury or trauma.  Pain only occurs when she does overhead motion.  She has been seen for this previously.  His legs over the anterior left shoulder.  Has not taken anything for her pain.  She denies any redness, swelling, warmth to her left shoulder.  Does not do repetitive motions.  Pain does not extend into her distal arm, chest, back.  #3 patient states she intermittently gets "reflux" and burning sensation to her epigastric area whenever she eats greasy or fatty foods.  She denies any abdominal pain from this.  Not take anything from this.  No emesis, diarrhea, right upper quadrant pain.  Patient denies fever, chills, nausea, vomiting, chest pain, shortness of breath, hemoptysis, abdominal pain, diarrhea, dysuria unilateral leg swelling, redness, warmth, paresthesias, rashes or lesions.  Denies additional aggravating or  alleviating factors.  History obtained from patient and past medical records.  No interpreter used.  HPI     Past Medical History:  Diagnosis Date  . Allergy   . Left knee pain   . 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 Pain in joint, lower leg 07/06/2012  . Stiffness of joint, not elsewhere classified, lower leg 07/06/2012  . Abnormality of gait 07/06/2012  . Patellar dislocation 07/06/2012    Past Surgical History:  Procedure Laterality Date  . KNEE SURGERY Left   . TYMPANOPLASTY  11/21/2012   Procedure: TYMPANOPLASTY;  Surgeon: Darletta Moll, MD;  Location:  [facility];  Service: ENT;  Laterality: Right;  with fascia graft   . TYMPANOSTOMY TUBE PLACEMENT  age 89     OB History    Gravida      Para      Term      Preterm      AB      Living  0     SAB      IAB      Ectopic      Multiple      Live Births              Family History  Problem Relation Age of Onset  . Cancer Other   . Heart disease Other  Social History   Tobacco Use  . Smoking status: Never Smoker  . Smokeless tobacco: Never Used  Vaping Use  . Vaping Use: Never used  Substance Use Topics  . Alcohol use: No  . Drug use: No    Home Medications Prior to Admission medications   Medication Sig Start Date End Date Taking? Authorizing Provider  lidocaine (LIDODERM) 5 % Place 1 patch onto the skin daily. Remove & Discard patch within 12 hours or as directed by MD 12/10/20  Yes Naylene Foell A, PA-C  naproxen (NAPROSYN) 500 MG tablet Take 1 tablet (500 mg total) by mouth 2 (two) times daily. 12/10/20  Yes Adalei Novell A, PA-C  pantoprazole (PROTONIX) 20 MG tablet Take 1 tablet (20 mg total) by mouth daily. 12/10/20  Yes Alda Gaultney A, PA-C  sucralfate (CARAFATE) 1 g tablet Take 1 tablet (1 g total) by mouth 4 (four) times daily -  with meals and at bedtime for 14 days. 12/10/20 12/24/20 Yes  Tylor Courtwright A, PA-C  cephALEXin (KEFLEX) 500 MG capsule Take 1 capsule (500 mg total) by mouth 2 (two) times daily. 09/09/19   Petrucelli, Samantha R, PA-C  fexofenadine (ALLEGRA) 180 MG tablet Take 180 mg by mouth daily.    [provider]  metroNIDAZOLE (FLAGYL) 500 MG tablet Take 1 tablet (500 mg total) by mouth 2 (two) times daily. Please take with food 05/26/19   Ivery Quale, PA-C    Allergies    Patient has no known allergies.  Review of Systems   Review of Systems  Constitutional: Negative.   HENT: Negative.   Respiratory: Negative.   Cardiovascular: Negative.        Left breast pain  Gastrointestinal:       Epigastric burning x months  Genitourinary: Negative.   Musculoskeletal: Negative for arthralgias, back pain, gait problem, joint swelling, myalgias, neck pain and neck stiffness.       Left shoulder pain x 3 months  Skin: Negative.   Neurological: Negative.   All other systems reviewed and are negative.   Physical Exam Updated Vital Signs BP 112/74 (BP Location: Right Arm)   Pulse (!) 106   Temp 98.4 F (36.9 C) (Oral)   Resp 18   Ht 5\' 5"  (1.651 m)   Wt 50.8 kg   LMP 11/21/2020   SpO2 98%   BMI 18.64 kg/m   Physical Exam Vitals and nursing note reviewed. Exam conducted with a chaperone present.  Constitutional:      General: She is not in acute distress.    Appearance: She is well-developed and well-nourished. She is not ill-appearing, toxic-appearing or diaphoretic.  HENT:     Head: Normocephalic and atraumatic.     Nose: Nose normal.     Mouth/Throat:     Mouth: Mucous membranes are moist.  Eyes:     Pupils: Pupils are equal, round, and reactive to light.  Cardiovascular:     Rate and Rhythm: Normal rate.     Pulses: Normal pulses and intact distal pulses.     Heart sounds: Normal heart sounds.  Pulmonary:     Effort: Pulmonary effort is normal. No respiratory distress.     Breath sounds: Normal breath sounds.     Comments:  Clear to auscultation without wheeze, rhonchi or rales.  Speaks in full sentences without difficulty. Chest:     Chest wall: No mass, lacerations, deformity, swelling or crepitus. There is no dullness to percussion.  Breasts:  Right: Normal.     Left: Tenderness present. No swelling, bleeding, inverted nipple, mass, nipple discharge or skin change.        Comments: Mild tenderness to left upper lower outer quadrant of breast.  No nipple inversion, discharge.  No surrounding edema, erythema or warmth to breast.  Small, firm, mobile, multiple cystic type masses to left breast.  Normal right breast Abdominal:     General: Bowel sounds are normal. There is no distension.     Palpations: Abdomen is soft. There is no mass.     Tenderness: There is no abdominal tenderness. There is no right CVA tenderness, left CVA tenderness, guarding or rebound. Negative signs include Murphy's sign and McBurney's sign.     Hernia: No hernia is present.     Comments: Soft, nontender without rebound or guarding.  Negative Murphy sign  Musculoskeletal:        General: Normal range of motion.     Right shoulder: Normal.     Left shoulder: Tenderness present. No swelling, deformity, effusion, laceration, bony tenderness or crepitus. Normal range of motion. Normal strength. Normal pulse.     Cervical back: Normal range of motion.     Comments: Tenderness to anterior left shoulder with overhead movement greater than 90 degrees.  Positive Hawkins test.  No bony tenderness to clavicle, scapula, midshaft, distal humerus, arm.  No overlying skin changes. No joint effusion.  Skin:    General: Skin is warm and dry.     Capillary Refill: Capillary refill takes less than 2 seconds.     Comments: No edema, erythema, warmth, fluctuance or induration.  Neurological:     General: No focal deficit present.     Mental Status: She is alert.     Cranial Nerves: Cranial nerves are intact.     Sensory: Sensation is intact.      Motor: Motor function is intact.     Coordination: Coordination is intact.     Gait: Gait is intact.     Comments: Intact sensation Equal strength bilaterally No tremor Intact coordination  Psychiatric:        Mood and Affect: Mood and affect normal.     ED Results / Procedures / Treatments   Labs (all labs ordered are listed, but only abnormal results are displayed) Labs Reviewed - No data to display  EKG None  Radiology No results found.  Procedures Procedures (including critical care time)  Medications Ordered in ED Medications - No data to display  ED Course  I have reviewed the triage vital signs and the nursing notes.  Pertinent labs & imaging results that were available during my care of the patient were reviewed by me and considered in my medical decision making (see chart for details).  Patient presents for evaluation multiple complaints.  She appears otherwise well.  Afebrile, nonseptic, not ill-appearing.  1-with regards to her breast mass.  Feel that these are likely cystic lesions given they have been present x1 year.  She does not have overlying skin changes to suggest cellulitis.  Masses do not resemble abscess.  I discussed follow-up outpatient with OB/GYN for possible mammogram.  She has OB[facility]GYN at [facility] in [place] however she is looking for OB in [place] where she goes to school.  She has no chest wall pain to suggest atypical ACS, PE, dissection will place referral.  I discussed heat, Tylenol, Motrin as needed for pain.  2-chronic left shoulder pain.  Occurs with overhead movement.  Does  not do repetitive motions.  She has no overlying skin changes.  Does have positive Hawkins test.  Pain is over her anterior left shoulder.  No recent injury or trauma.  No does not appear overtly infected.  She has no gross effusion on exam.  Given no trauma do not feel she needs imaging at this time.  I discussed lidocaine patches, rest, follow-up with orthopedics, clinical  concern for possible rotator cuff tear.  I have low suspicion for septic joint, gout, hemarthrosis, rhabdomyolysis, VTE, fracture, dislocation.  3-patient with reflux type symptoms after eating.  Her abdomen is soft, nontender.  This has been occurring times months.  She has negative Murphy sign.  There is no emesis and is having normal bowel movements.  Do not feel any labs or imaging at this time.  Discussed starting on Carafate, PPI and follow-up with PCP for reevaluation.  She is agreeable to this.  On repeat exam patient does not have a surgical abdomin and there are no peritoneal signs.  No indication of appendicitis, bowel obstruction, bowel perforation, cholecystitis, diverticulitis, PID or ectopic pregnancy.     Patient here for evaluation multiple complaints.  She appears otherwise well.  Fortunately it seems that she has had quite a few issues for an extended period of time.  She does not appear septic or ill at this time.  She is tolerating p.o. intake.  She has a nonfocal neuro exam and a normal musculoskeletal exam.  Will refer to outpatient providers however I did stress to patient establishing care with a PCP as they can follow-up with her referrals and specialist she is agreeable for this.  Return for any worsening symptoms.  The patient has been appropriately medically screened and/or stabilized in the ED. I have low suspicion for any other emergent medical condition which would require further screening, evaluation or treatment in the ED or require inpatient management.  Patient is hemodynamically stable and in no acute distress.  Patient able to ambulate in department prior to ED.  Evaluation does not show acute pathology that would require ongoing or additional emergent interventions while in the emergency department or further inpatient treatment.  I have discussed the diagnosis with the patient and answered all questions.  Pain is been managed while in the emergency department and  patient has no further complaints prior to discharge.  Patient is comfortable with plan discussed in room and is stable for discharge at this time.  I have discussed strict return precautions for returning to the emergency department.  Patient was encouraged to follow-up with PCP/specialist refer to at discharge.    MDM Rules/Calculators/A&P                          Final Clinical Impression(s) / ED Diagnoses Final diagnoses:  Breast mass  Chronic left shoulder pain  Gastroesophageal reflux disease, unspecified whether esophagitis present    Rx / DC Orders ED Discharge Orders         Ordered    Ambulatory referral to Obstetrics / Gynecology        12/10/20 1241    AMB referral to orthopedics        12/10/20 1241    sucralfate (CARAFATE) 1 g tablet  3 times daily with meals & bedtime        12/10/20 1242    lidocaine (LIDODERM) 5 %  Every 24 hours        12/10/20 1242  pantoprazole (PROTONIX) 20 MG tablet  Daily        12/10/20 1242    naproxen (NAPROSYN) 500 MG tablet  2 times daily        12/10/20 1242           Estevan Kersh A, PA-C 12/10/20 1243    Benjiman CorePickering, Nathan, MD 12/10/20 1525

## 2020-12-12 ENCOUNTER — Encounter: Payer: Self-pay | Admitting: Orthopaedic Surgery

## 2020-12-12 ENCOUNTER — Other Ambulatory Visit: Payer: Self-pay

## 2020-12-12 ENCOUNTER — Ambulatory Visit: Payer: BC Managed Care – PPO

## 2020-12-12 ENCOUNTER — Ambulatory Visit (INDEPENDENT_AMBULATORY_CARE_PROVIDER_SITE_OTHER): Payer: BC Managed Care – PPO | Admitting: Orthopaedic Surgery

## 2020-12-12 VITALS — BP 130/80 | HR 102 | Ht 66.0 in | Wt 114.0 lb

## 2020-12-12 DIAGNOSIS — M25512 Pain in left shoulder: Secondary | ICD-10-CM | POA: Diagnosis not present

## 2020-12-12 DIAGNOSIS — G8929 Other chronic pain: Secondary | ICD-10-CM

## 2020-12-12 NOTE — Progress Notes (Signed)
Subjective:    Patient ID: Janet Lynch, female    DOB: [birth year], 20 y.o.   MRN: [id number]  HPI She has been having pain in the left shoulder since November 2021. She woke up with shoulder pain then. She has no trauma. She has pain with motion of the shoulder overhead and laying on it in bed. The pain is deep.  It is intense at times.  Cold weather makes it worse.  She cannot take NSAIDs as they upset her stomach, including Advil.  She has used ice, heat.  She went to the ER on 12-10-20.  I have reviewed the notes.  No X-rays were done.  She is concerned she is not getting better and she continues to hurt.   Review of Systems  Constitutional: Positive for activity change.  Musculoskeletal: Positive for arthralgias and myalgias.  All other systems reviewed and are negative.  For Review of Systems, all other systems reviewed and are negative.  The following is a summary of the past history medically, past history surgically, known current medicines, social history and family history.  This information is gathered electronically by the computer from prior information and documentation.  I review this each visit and have found including this information at this point in the chart is beneficial and informative.   Past Medical History:  Diagnosis Date  . Allergy   . Left knee pain   . 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 KNEE SURGERY Left   . TYMPANOPLASTY  11/21/2012   Procedure: TYMPANOPLASTY;  Surgeon: Darletta Moll, MD;  Location: [facility];  Service: ENT;  Laterality: Right;  with fascia graft   . TYMPANOSTOMY TUBE PLACEMENT  age 7    Current Outpatient Medications on File Prior to Visit  Medication Sig Dispense Refill  . naproxen (NAPROSYN) 500 MG tablet Take 1 tablet (500 mg total) by mouth 2 (two) times daily. 30 tablet 0  . cephALEXin (KEFLEX) 500 MG capsule Take 1 capsule (500 mg total)  by mouth 2 (two) times daily. (Patient not taking: Reported on 12/12/2020) 10 capsule 0  . fexofenadine (ALLEGRA) 180 MG tablet Take 180 mg by mouth daily. (Patient not taking: Reported on 12/12/2020)    . lidocaine (LIDODERM) 5 % Place 1 patch onto the skin daily. Remove & Discard patch within 12 hours or as directed by MD 30 patch 0  . metroNIDAZOLE (FLAGYL) 500 MG tablet Take 1 tablet (500 mg total) by mouth 2 (two) times daily. Please take with food (Patient not taking: Reported on 12/12/2020) 14 tablet 0  . pantoprazole (PROTONIX) 20 MG tablet Take 1 tablet (20 mg total) by mouth daily. 30 tablet 0  . sucralfate (CARAFATE) 1 g tablet Take 1 tablet (1 g total) by mouth 4 (four) times daily -  with meals and at bedtime for 14 days. 56 tablet 0   No current facility-administered medications on file prior to visit.    Social History   Socioeconomic History  . Marital status: Single    Spouse name: Not on file  . Number of children: Not on file  . Years of education: Not on file  . Highest education level: Not on file  Occupational History  . Not on file  Tobacco Use  . Smoking status: Never Smoker  . Smokeless tobacco: Never Used  Vaping Use  . Vaping Use: Never used  Substance and Sexual  Activity  . Alcohol use: No  . Drug use: No  . Sexual activity: Not on file  Other Topics Concern  . Not on file  Social History Narrative  . Not on file   Social Determinants of Health   Financial Resource Strain: Not on file  Food Insecurity: Not on file  Transportation Needs: Not on file  Physical Activity: Not on file  Stress: Not on file  Social Connections: Not on file  Intimate Partner Violence: Not on file    Family History  Problem Relation Age of Onset  . Cancer Other   . Heart disease Other     BP 130/80   Pulse (!) 102   Ht 5\' 6"  (1.676 m)   Wt 114 lb (51.7 kg)   LMP 11/21/2020 (Exact Date)   BMI 18.40 kg/m   Body mass index is 18.4 kg/m.      Objective:    Physical Exam Vitals and nursing note reviewed. Exam conducted with a chaperone present.  Constitutional:      Appearance: She is well-developed and well-nourished.  HENT:     Head: Normocephalic and atraumatic.  Eyes:     Extraocular Movements: EOM normal.     Conjunctiva/sclera: Conjunctivae normal.     Pupils: Pupils are equal, round, and reactive to light.  Cardiovascular:     Rate and Rhythm: Normal rate and regular rhythm.     Pulses: Intact distal pulses.  Pulmonary:     Effort: Pulmonary effort is normal.  Abdominal:     Palpations: Abdomen is soft.  Musculoskeletal:       Arms:     Cervical back: Normal range of motion and neck supple.  Skin:    General: Skin is warm and dry.  Neurological:     Mental Status: She is alert and oriented to person, place, and time.     Cranial Nerves: No cranial nerve deficit.     Motor: No abnormal muscle tone.     Coordination: Coordination normal.     Deep Tendon Reflexes: Reflexes are normal and symmetric. Reflexes normal.  Psychiatric:        Mood and Affect: Mood and affect normal.        Behavior: Behavior normal.        Thought Content: Thought content normal.        Judgment: Judgment normal.     X-rays were done of the left shoulder, reported separately.     Assessment & Plan:   Encounter Diagnosis  Name Primary?  . Chronic left shoulder pain Yes   I am concerned about rotator cuff tear.  She cannot take NSAIDs.  I will get MRI of the left shoulder.  Return in two weeks.  Call if any problem.  Precautions discussed.   Electronically Signed 11/23/2020, MD 1/20/20228:53 AM

## 2020-12-18 ENCOUNTER — Other Ambulatory Visit: Payer: Self-pay

## 2020-12-18 ENCOUNTER — Ambulatory Visit (INDEPENDENT_AMBULATORY_CARE_PROVIDER_SITE_OTHER): Payer: BC Managed Care – PPO

## 2020-12-18 VITALS — BP 98/67 | HR 98 | Ht 65.0 in | Wt 116.4 lb

## 2020-12-18 DIAGNOSIS — Z1239 Encounter for other screening for malignant neoplasm of breast: Secondary | ICD-10-CM

## 2020-12-18 DIAGNOSIS — Z0000 Encounter for general adult medical examination without abnormal findings: Secondary | ICD-10-CM

## 2020-12-18 DIAGNOSIS — K219 Gastro-esophageal reflux disease without esophagitis: Secondary | ICD-10-CM

## 2020-12-18 DIAGNOSIS — N644 Mastodynia: Secondary | ICD-10-CM

## 2020-12-18 DIAGNOSIS — N946 Dysmenorrhea, unspecified: Secondary | ICD-10-CM

## 2020-12-18 DIAGNOSIS — N63 Unspecified lump in unspecified breast: Secondary | ICD-10-CM

## 2020-12-18 MED ORDER — FAMOTIDINE 20 MG PO TABS: 20.0000 mg | ORAL_TABLET | Freq: Two times a day (BID) | ORAL | 2 refills | Status: DC

## 2020-12-18 NOTE — Patient Instructions (Signed)
Fibroadenoma A fibroadenoma is a breast tumor that is not cancerous (is benign). These tumors may feel like lumps that move around slightly when touched. They are made up of breast tissue and tissue that holds breast tissue together (connective tissue). There are several types of fibroadenoma:  Simple fibroadenoma. This is the most common type. It contains only one type of tissue.  Complex fibroadenoma. This type contains more than one kind of tissue or irregular tissue, such as pockets of fluid (cysts) or deposits of calcium (calcifications) in the breast.  Juvenile fibroadenoma. This is a type of tumor that can develop in adolescent girls. It tends to grow larger over time than other kinds of benign tumors. Having a complex or juvenile fibroadenoma may slightly increase the risk for developing breast cancer in the future. What are the causes? The cause of fibroadenoma is not known. What increases the risk? This condition is more likely to develop in:  Women who are between the ages of 58 and 43.  Women of African American descent. What are the signs or symptoms? You might have no symptoms. Some fibroadenomas are too small to feel. If you can feel it, it may feel like a lump in your breast that is:  Firm.  Round.  Smooth.  Slightly movable. A fibroadenoma usually occurs as a single lump, but sometimes there may be more than one lump. They can occur in one breast or in both breasts. Fibroadenomas vary in size. They usually do not cause pain unless they grow large. How is this diagnosed? This condition may be diagnosed based on:  Your symptoms and medical history.  A physical exam. You may notice a lump during a breast self-exam, or your health care provider may notice it during a routine breast exam or breast X-ray (mammogram).  An ultrasound to check for fluid inside the lump (cystic tumor). If there is fluid, some fluid may be removed with a needle and examined under a  microscope.  A mammogram to examine a lump that does not contain fluid (solid). Depending on mammogram results, you may need to have a procedure to remove a tissue sample from the lump using a needle (breast biopsy). The tissue will be examined under a microscope. How is this treated? Treatment for this condition may include:  Having clinical breast exams regularly to check for changes in your fibroadenoma.  Having the fibroadenoma removed. A fibroadenoma may be removed if it is: ? Large. ? Continuing to grow. ? Causing symptoms, such as pain or a change in the appearance of your breast. ? A juvenile fibroadenoma. These tend to grow large over time. Follow these instructions at home:  Do breast self-exams at home as told by your health care provider. Monitor your fibroadenoma. Check the skin of your breasts and your nipples for any changes.  Do not use any products that contain nicotine or tobacco. These products include cigarettes, chewing tobacco, and vaping devices, such as e-cigarettes. These can further increase your cancer risk. If you need help quitting, ask your health care provider.  Keep all follow-up visits. This is important. You will need breast exams on a regular basis.   Contact a health care provider if:  Your fibroadenoma: ? Gets larger. ? Feels different. ? Becomes painful.  You find a new breast lump.  You have any changes in your breast skin, such as: ? Dimpling. ? Bruising. ? Thickening. ? Redness.  You have any changes in your nipple, such as: ? Fluid  leaking from a nipple. ? Redness. Summary  Fibroadenoma is a breast tumor that is not cancerous (is benign). It is made up of breast tissue and tissue that holds breast tissue together (connective tissue).  Having a complex or juvenile fibroadenoma may slightly increase your risk for developing breast cancer in the future.  A fibroadenoma may feel like a lump in your breast. It is usually firm, round,  smooth, and slightly movable. Some fibroadenomas are too small to be felt.  Do breast self-exams at home as told by your health care provider. Monitor your fibroadenoma, the skin of your breasts, and your nipples for any changes. This information is not intended to replace advice given to you by your health care provider. Make sure you discuss any questions you have with your health care provider. Document Revised: 09/09/2020 Document Reviewed: 09/09/2020 Elsevier Patient Education  2021 [employer].

## 2020-12-18 NOTE — Progress Notes (Signed)
GYNECOLOGY OFFICE VISIT NOTE-WELL WOMAN EXAM  History:   Janet Lynch [id number] here today for breast evaluation. Patient reports she has been having left side breast pain for the past year.  She states it gets hot to the touch and a mass develops during her menstrual cycles.  She reports she was evaluated in the hospital last week and was told she had no signs of infection, but a mass was noted.  She states that after her menses, the symptoms resolve although she also states she does experience some pain and is able to palpate some mass.  She reports her maternal great aunts and her maternal grandmother has had breast cancer.  She reports her maternal grandfather also had family members with breast cancer. She is unsure of family history regarding, uterine, cervical, or ovarian cancer.   She reports monthly menses that lasts 5-6 days.  She states the flow is "super heavy." She describes this as usage of 4 x-long nighttime pads daily.  She reports her menses has gotten heavier in the past year and a half.  She reports she also has more cramping that can wake her from her sleep. She reports taking ibuprofen in the past, but has discontinued due to issues with reflux.  She states now she uses teas and heating pads for relief, but usually "just toughs through it." She endorses passing of clots that are the size of a golf ball. Patient reports her reflex continues despite ibuprofen discontinuation.  She states she has not taken the medication prescribed in the ER and questions what interventions she can do to decrease incidents of reflex.    Birth Control: Condoms  Reproductive Concerns: Partners in last year: One She denies any abnormal vaginal discharge, bleeding, or pelvic pain. STD Testing: Declines Breast Exams: Frequently-"trying to see what's up."   Medical and Nutrition: PCP: None Exercise: 3x/week-Cardiovascular Tobacco/Drugs/Alcohol: None Nutrition: "I eat pretty healthy  diet."  Social: Safety at home: Endorses DV/A: Denies Social Support:Endorses School: [employer]  Past Medical History:  Diagnosis Date  . Allergy   . Left knee pain   . 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 KNEE SURGERY Left   . TYMPANOPLASTY  11/21/2012   Procedure: TYMPANOPLASTY;  Surgeon: Darletta Moll, MD;  Location: [facility];  Service: ENT;  Laterality: Right;  with fascia graft   . TYMPANOSTOMY TUBE PLACEMENT  age 42    The following portions of the patient's history were reviewed and updated as appropriate: allergies, current medications, past family history, past medical history, past social history, past surgical history and problem list.   Health Maintenance:  No pap history d/t age.    Review of Systems:  Pertinent items noted in HPI and remainder of comprehensive ROS otherwise negative.    Objective:    Physical Exam BP 98/67 (BP Location: Right Arm, Patient Position: Sitting, Cuff Size: Normal)   Pulse 98   Ht 5\' 5"  (1.651 m)   Wt 116 lb 6.4 oz (52.8 kg)   LMP 12/17/2020 (Exact Date)   BMI 19.37 kg/m  Physical Exam Vitals reviewed.  Constitutional:      General: She is not in acute distress.    Appearance: Normal appearance.  HENT:     Head: Normocephalic and atraumatic.  Eyes:     Conjunctiva/sclera: Conjunctivae normal.  Cardiovascular:     Rate and Rhythm: Normal rate and  regular rhythm.  Pulmonary:     Effort: Pulmonary effort is normal. No respiratory distress.     Breath sounds: Normal breath sounds.  Chest:  Breasts:     Right: Mass present. No tenderness.     Left: Mass and tenderness (Mild) present. No nipple discharge or skin change.      Comments: CBE performed -Left side mass palpated in upper outer and inner area and lower outer area.  -Right side mass palpated in upper outer and lower outer area. No apparent  tenderness.  Abdominal:     General: Abdomen is flat. Bowel sounds are normal.     Palpations: Abdomen is soft.     Tenderness: There is no abdominal tenderness.  Musculoskeletal:        General: Normal range of motion.     Cervical back: Normal range of motion. No rigidity.  Skin:    General: Skin is warm and dry.  Neurological:     Mental Status: She is alert and oriented to person, place, and time.  Psychiatric:        Mood and Affect: Mood normal.        Behavior: Behavior normal.        Thought Content: Thought content normal.      Labs and Imaging No results found for this or any previous visit (from the past 168 hour(s)). DG Shoulder Left  Result Date: 12/12/2020 Clinical:  Left shoulder pain since November, 2021, not improving X-rays were done of the left shoulder, two views. The humeral head is well located within the glenoid area.  No fracture or loose body noted.  Bone quality is good. Apex of lung is clear. Impression:  Negative left shoulder, no acute findings. Electronically Signed Darreld Mclean, MD 1/20/20228:44 AM     Assessment & Plan:  20 year old Well Woman Exam  Breast Exam Breast Mass Dysmenorrhea GERD   1. Dysmenorrhea -Discussed menstrual cycle and identified symptoms of concern including pain, flow, and passing of clots. -Discussed how underlying conditions such as fibroids can cause said symptoms. -Patient agreeable to pelvic US for evaluation. -[place] ordered.  2. Breast lump or mass -Bilateral outer breast lumps, with diffuse findings throughout left breast. -Discussed need for follow up with breast ultrasound. -[place] ordered and scheduled.  -Informed that provider will contact when results available.   3. Breast tenderness -Left Side only. -Informed that findings are significant for fibroadenoma, which occurs commonly in young women. -Discussed types including cystic which may require drainage.  4. Gastroesophageal reflux disease without  esophagitis -Discussed reflux and management. -Encouraged to take limited prescription of PPI. -Discussed usage of Tums prn for breakthrough heartburn. -Rx for pepcid 20mg  BID sent to pharmacy on file.   5. Well woman exam without gynecological exam Brief review of well woman exam and what to expect: *Informed that formal speculum exam with pap smear to begin at 20 years old based on ASCCP guidelines. *Informed that CBE will start at age 34 based on ACOG guidelines unless other factors arise prior to that. -Encouraged to activate Mychart. -Educated on AHA exercise recommendations of 30 minutes of moderate to vigorous activity at least 5x/week. -Encouraged to continue balanced nutritional intake.   Routine preventative health maintenance measures emphasized. Please refer to After Visit Summary for other counseling recommendations.   No follow-ups on file.      22, CNM 12/18/2020

## 2020-12-31 ENCOUNTER — Encounter: Payer: Self-pay | Admitting: Radiology

## 2021-01-08 ENCOUNTER — Ambulatory Visit
Admission: RE | Admit: 2021-01-08 | Discharge: 2021-01-08 | Disposition: A | Discharge status: Home / Self Care | Payer: BC Managed Care – PPO | Source: Ambulatory Visit

## 2021-01-08 ENCOUNTER — Other Ambulatory Visit: Payer: Self-pay

## 2021-01-08 DIAGNOSIS — N644 Mastodynia: Secondary | ICD-10-CM

## 2021-01-08 DIAGNOSIS — N63 Unspecified lump in unspecified breast: Secondary | ICD-10-CM

## 2021-01-08 NOTE — Progress Notes (Signed)
Please call patient and discuss recommendation for genetic counseling.  If patient would like we can send her to our genetic counselor.   Thanks,  JE

## 2021-01-10 ENCOUNTER — Telehealth: Payer: Self-pay | Admitting: *Deleted

## 2021-01-10 NOTE — Telephone Encounter (Signed)
-----   Message from Gerrit Heck, [place] sent at 01/08/2021  3:33 PM EST ----- Please call patient and discuss recommendation for genetic counseling.  If patient would like we can send her to our genetic counselor.   Thanks,  JE

## 2021-01-10 NOTE — Telephone Encounter (Signed)
Unable to leave voice message, mailbox full. Will send a Mychart message.  Clovis Pu, RN

## 2021-01-13 NOTE — Telephone Encounter (Signed)
Tried calling patient this AM. No message left, was not sure if this was the correct number. Voice message voice was from someone with different name.  Clovis Pu, RN

## 2021-01-29 ENCOUNTER — Ambulatory Visit (HOSPITAL_COMMUNITY)
Admission: RE | Admit: 2021-01-29 | Discharge: 2021-01-29 | Disposition: A | Discharge status: Home / Self Care | Payer: BC Managed Care – PPO | Source: Ambulatory Visit | Attending: Orthopaedic Surgery | Admitting: Orthopaedic Surgery

## 2021-01-29 ENCOUNTER — Other Ambulatory Visit: Payer: Self-pay

## 2021-01-29 DIAGNOSIS — M25512 Pain in left shoulder: Secondary | ICD-10-CM | POA: Diagnosis not present

## 2021-01-29 DIAGNOSIS — G8929 Other chronic pain: Secondary | ICD-10-CM | POA: Diagnosis present

## 2022-05-06 ENCOUNTER — Other Ambulatory Visit: Payer: Self-pay

## 2022-05-06 ENCOUNTER — Emergency Department (HOSPITAL_COMMUNITY): Payer: BC Managed Care – PPO

## 2022-05-06 ENCOUNTER — Emergency Department (HOSPITAL_COMMUNITY)
Admission: EM | Admit: 2022-05-06 | Discharge: 2022-05-06 | Disposition: A | Discharge status: Home / Self Care | Payer: Self-pay | Attending: Student | Admitting: Student

## 2022-05-06 ENCOUNTER — Encounter (HOSPITAL_COMMUNITY): Payer: Self-pay | Admitting: *Deleted

## 2022-05-06 DIAGNOSIS — G43809 Other migraine, not intractable, without status migrainosus: Secondary | ICD-10-CM | POA: Insufficient documentation

## 2022-05-06 DIAGNOSIS — H5711 Ocular pain, right eye: Secondary | ICD-10-CM

## 2022-05-06 MED ORDER — DIPHENHYDRAMINE HCL 50 MG/ML IJ SOLN
25.0000 mg | Freq: Once | INTRAMUSCULAR | Status: AC
  Administered 2022-05-06: 25 mg via INTRAVENOUS
  Filled 2022-05-06: qty 1

## 2022-05-06 MED ORDER — PROCHLORPERAZINE EDISYLATE 10 MG/2ML IJ SOLN
10.0000 mg | Freq: Once | INTRAMUSCULAR | Status: AC
  Administered 2022-05-06: 10 mg via INTRAVENOUS
  Filled 2022-05-06: qty 2

## 2022-05-06 MED ORDER — LACTATED RINGERS IV BOLUS
1000.0000 mL | Freq: Once | INTRAVENOUS | Status: AC
  Administered 2022-05-06: 1000 mL via INTRAVENOUS

## 2022-05-06 NOTE — ED Triage Notes (Signed)
Pt with bilateral eye pain for a week with light sensitivity.  Pt states her pupils are dilated and blurry vision. Has not seen an eye doctor, denies glasses or contacts.  + HA  Currently on zyrtec for allergies.
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
## 2022-05-06 NOTE — ED Notes (Signed)
Pt ambulated to the restroom.

## 2022-11-18 ENCOUNTER — Encounter (HOSPITAL_COMMUNITY): Payer: Self-pay

## 2022-11-18 ENCOUNTER — Other Ambulatory Visit: Payer: Self-pay

## 2022-11-18 DIAGNOSIS — Z5321 Procedure and treatment not carried out due to patient leaving prior to being seen by health care provider: Secondary | ICD-10-CM | POA: Insufficient documentation

## 2022-11-18 DIAGNOSIS — M546 Pain in thoracic spine: Secondary | ICD-10-CM | POA: Insufficient documentation

## 2022-11-18 DIAGNOSIS — W228XXA Striking against or struck by other objects, initial encounter: Secondary | ICD-10-CM | POA: Insufficient documentation
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
## 2022-11-19 ENCOUNTER — Emergency Department (HOSPITAL_COMMUNITY)
Admission: EM | Admit: 2022-11-19 | Discharge: 2022-11-19 | Discharge status: Against Medical Advice | Payer: Managed Care, Other (non HMO) | Attending: Emergency Medicine | Admitting: Emergency Medicine

## 2022-11-20 ENCOUNTER — Emergency Department (HOSPITAL_COMMUNITY)
Admission: EM | Admit: 2022-11-20 | Discharge: 2022-11-20 | Disposition: A | Discharge status: Home / Self Care | Payer: Managed Care, Other (non HMO) | Attending: Emergency Medicine | Admitting: Emergency Medicine

## 2022-11-20 ENCOUNTER — Emergency Department (HOSPITAL_COMMUNITY): Payer: Managed Care, Other (non HMO)

## 2022-11-20 ENCOUNTER — Encounter (HOSPITAL_COMMUNITY): Payer: Self-pay

## 2022-11-20 DIAGNOSIS — M542 Cervicalgia: Secondary | ICD-10-CM | POA: Diagnosis not present

## 2022-11-20 DIAGNOSIS — Y92410 Unspecified street and highway as the place of occurrence of the external cause: Secondary | ICD-10-CM | POA: Insufficient documentation

## 2022-11-20 MED ORDER — LIDOCAINE 5 % EX PTCH
1.0000 | MEDICATED_PATCH | CUTANEOUS | Status: DC
  Administered 2022-11-20: 1 via TRANSDERMAL
  Filled 2022-11-20: qty 1

## 2022-11-20 MED ORDER — LIDOCAINE 5 % EX PTCH: 1.0000 | MEDICATED_PATCH | CUTANEOUS | Status: DC

## 2022-11-20 MED ORDER — LIDOCAINE 5 % EX PTCH: 1.0000 | MEDICATED_PATCH | CUTANEOUS | 0 refills | Status: DC

## 2022-11-20 MED ORDER — ACETAMINOPHEN 325 MG PO TABS
650.0000 mg | ORAL_TABLET | Freq: Once | ORAL | Status: AC
  Administered 2022-11-20: 650 mg via ORAL
  Filled 2022-11-20: qty 2
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
## 2022-12-10 ENCOUNTER — Encounter: Payer: Self-pay | Admitting: Family Medicine

## 2022-12-10 ENCOUNTER — Ambulatory Visit: Payer: Managed Care, Other (non HMO) | Admitting: Family Medicine

## 2022-12-10 VITALS — BP 106/73 | HR 109 | Ht 65.0 in | Wt 135.0 lb

## 2022-12-10 DIAGNOSIS — F419 Anxiety disorder, unspecified: Secondary | ICD-10-CM | POA: Diagnosis not present

## 2022-12-10 DIAGNOSIS — Z1159 Encounter for screening for other viral diseases: Secondary | ICD-10-CM

## 2022-12-10 DIAGNOSIS — Z9889 Other specified postprocedural states: Secondary | ICD-10-CM | POA: Diagnosis not present

## 2022-12-10 DIAGNOSIS — F32A Depression, unspecified: Secondary | ICD-10-CM | POA: Insufficient documentation

## 2022-12-10 DIAGNOSIS — E559 Vitamin D deficiency, unspecified: Secondary | ICD-10-CM

## 2022-12-10 DIAGNOSIS — Z114 Encounter for screening for human immunodeficiency virus [HIV]: Secondary | ICD-10-CM

## 2022-12-10 DIAGNOSIS — J3089 Other allergic rhinitis: Secondary | ICD-10-CM | POA: Diagnosis not present

## 2022-12-10 DIAGNOSIS — Z131 Encounter for screening for diabetes mellitus: Secondary | ICD-10-CM

## 2022-12-10 DIAGNOSIS — E7800 Pure hypercholesterolemia, unspecified: Secondary | ICD-10-CM

## 2022-12-10 DIAGNOSIS — H9191 Unspecified hearing loss, right ear: Secondary | ICD-10-CM

## 2022-12-10 DIAGNOSIS — Z118 Encounter for screening for other infectious and parasitic diseases: Secondary | ICD-10-CM

## 2022-12-10 DIAGNOSIS — Z1329 Encounter for screening for other suspected endocrine disorder: Secondary | ICD-10-CM

## 2022-12-10 MED ORDER — MONTELUKAST SODIUM 10 MG PO TABS: 10.0000 mg | ORAL_TABLET | Freq: Every day | ORAL | 1 refills | Status: DC

## 2022-12-10 NOTE — Patient Instructions (Addendum)
I appreciate the opportunity to provide care to you today!    Follow up:  2 weeks pap smear  Labs: please stop by the lab during the week to get your blood drawn (CBC, CMP, TSH, Lipid profile, HgA1c, Vit D)  Screening: HIV and Hep C  Please pick up your medication at the pharmacy   Referral: ENT and Allergy   Please continue to a heart-healthy diet and increase your physical activities. Try to exercise for 78mins at least five times a week.      It was a pleasure to see you and I look forward to continuing to work together on your health and well-being. Please do not hesitate to call the office if you need care or have questions about your care.   Have a wonderful day and week. With Gratitude, Alvira Monday MSN, FNP-BC

## 2022-12-10 NOTE — Assessment & Plan Note (Addendum)
She reports increased anxiety due to increased life stresses The patient is currently speaking with a therapist and does not want to start pharmacological  therapy at the moment She denies suicidal ideation and homicidal ideation We will continue to monitor

## 2022-12-10 NOTE — Progress Notes (Addendum)
Acute Office Visit  Subjective:    Patient ID: Janet Lynch, female    DOB: 26-May-2001, 22 y.o.   MRN: [id number]  Chief Complaint  Patient presents with   Establish Care    New patient, need pcp. Pt reports anxiousness more than usual. Would like a referral to an allergist, to rule out allergies.     HPI Patient is in today to establish care. For the details of today's visit, please refer to the assessment and plan.     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    Past Surgical History:  Procedure Laterality Date   KNEE SURGERY Left    TYMPANOPLASTY  11/21/2012   Procedure: TYMPANOPLASTY;  Surgeon: Darletta MollSui W Teoh, MD;  Location: [facility];  Service: ENT;  Laterality: Right;  with fascia graft    TYMPANOSTOMY TUBE PLACEMENT  age 89    Family History  Problem Relation Age of Onset   Cancer Other    Heart disease Other    Asthma Cousin    Eczema Cousin     Social History   Socioeconomic History   Marital status: Single    Spouse name: Not on file   Number of children: Not on file   Years of education: JR at [employer]   Highest education level: Some college, no degree  Occupational History   Not on file  Tobacco Use   Smoking status: Never    Passive exposure: Current   Smokeless tobacco: Never  Vaping Use   Vaping Use: Never used  Substance and Sexual Activity   Alcohol use: No    Comment: occ   Drug use: No   Sexual activity: Yes    Birth control/protection: None  Other Topics Concern   Not on file  Social History Narrative   Not on file   Social Determinants of Health   Financial Resource Strain: Not on file  Food Insecurity: Not on file  Transportation Needs: Not on file  Physical Activity: Not on file  Stress: Not on file  Social Connections: Not on file  Intimate Partner Violence: Not on file    Outpatient Medications Prior to  Visit  Medication Sig Dispense Refill   cephALEXin (KEFLEX) 500 MG capsule Take 1 capsule (500 mg total) by mouth 2 (two) times daily. (Patient not taking: Reported on 12/12/2020) 10 capsule 0   famotidine (PEPCID) 20 MG tablet Take 1 tablet (20 mg total) by mouth 2 (two) times daily. (Patient not taking: Reported on 12/10/2022) 14 tablet 2   fexofenadine (ALLEGRA) 180 MG tablet Take 180 mg by mouth daily. (Patient not taking: Reported on 12/12/2020)     lidocaine (LIDODERM) 5 % Place 1 patch onto the skin daily. Remove & Discard patch within 12 hours or as directed by MD (Patient not taking: Reported on 12/10/2022) 30 patch 0   metroNIDAZOLE (FLAGYL) 500 MG tablet Take 1 tablet (500 mg total) by mouth 2 (two) times daily. Please take with food (Patient not taking: Reported on 12/12/2020) 14 tablet 0   naproxen (NAPROSYN) 500 MG tablet Take 1 tablet (500 mg total) by mouth 2 (two) times daily. (Patient not taking: Reported on 12/18/2020) 30 tablet 0   pantoprazole (PROTONIX) 20 MG tablet Take 1 tablet (20 mg total) by mouth daily. (Patient not taking: Reported on 12/18/2020) 30 tablet 0  sucralfate (CARAFATE) 1 g tablet Take 1 tablet (1 g total) by mouth 4 (four) times daily -  with meals and at bedtime for 14 days. (Patient not taking: Reported on 12/18/2020) 56 tablet 0   No facility-administered medications prior to visit.    No Known Allergies  Review of Systems  Constitutional:  Negative for chills and fever.  HENT:         Reports decreased hearing and ringing in the right ear  Eyes:  Negative for visual disturbance.  Respiratory:  Negative for chest tightness and shortness of breath.   Neurological:  Negative for dizziness and headaches.       Objective:    Physical Exam HENT:     Head: Normocephalic.     Ears:     Comments: Normal whisper test in the right and left ear    Mouth/Throat:     Mouth: Mucous membranes are moist.  Cardiovascular:     Rate and Rhythm: Normal rate.      Heart sounds: Normal heart sounds.  Pulmonary:     Effort: Pulmonary effort is normal.     Breath sounds: Normal breath sounds.  Neurological:     Mental Status: She is alert.     BP 106/73   Pulse (!) 109   Ht 5\' 5"  (1.651 m)   Wt 135 lb 0.6 oz (61.3 kg)   LMP 10/27/2022   SpO2 98%   BMI 22.47 kg/m  Wt Readings from Last 3 Encounters:  02/05/23 134 lb 8 oz (61 kg)  01/13/23 134 lb (60.8 kg)  12/10/22 135 lb 0.6 oz (61.3 kg)       Assessment & Plan:  Perennial allergic rhinitis Assessment & Plan: History of allergies since childhood Reports taking over-the-counter antihistamine with minimal relief of her symptoms Patient is unsure of her triggers and would like a referral to an allergist Referral placed Will start patient on Singulair 10 mg daily  Orders: -     Ambulatory referral to Allergy -     Montelukast Sodium; Take 1 tablet (10 mg total) by mouth at bedtime.  Dispense: 30 tablet; Refill: 1  Hx of tympanostomy Assessment & Plan: History of tympanostomy in 2013 She reports decreased hearing and occasional ringing in the right ear She reports noticing symptoms for a year Ears are negative for impacted cerumen with no signs of infection and inflammation Normal whisper test in the right and left ear We will place a referral to ENT for follow evaluation     Anxiety Assessment & Plan: She reports increased anxiety due to increased life stresses The patient is currently speaking with a therapist and does not want to start pharmacological  therapy at the moment She denies suicidal ideation and homicidal ideation We will continue to monitor   Vitamin D deficiency -     VITAMIN D 25 Hydroxy (Vit-D Deficiency, Fractures)  Diabetes mellitus screening -     Hemoglobin A1c  Thyroid disorder screen -     TSH  Hypercholesteremia -     CMP14+EGFR -     Lipid panel -     CBC with Differential/Platelet  Encounter for hepatitis C screening test for low risk  patient -     Hepatitis C antibody  Screening for chlamydial disease -     Chlamydia trachomatis, DNA, amp probe  History of tympanoplasty of right ear -     Ambulatory referral to ENT  Decreased hearing, right -  Ambulatory referral to ENT  Encounter for screening for HIV -     HIV Antibody (routine testing w rflx)    Janet LarocheGloria  Kalief Kattner, FNP

## 2022-12-10 NOTE — Assessment & Plan Note (Signed)
History of allergies since childhood Reports taking over-the-counter antihistamine with minimal relief of her symptoms Patient is unsure of her triggers and would like a referral to an allergist Referral placed Will start patient on Singulair 10 mg daily

## 2022-12-10 NOTE — Assessment & Plan Note (Signed)
History of tympanostomy in 2013 She reports decreased hearing and occasional ringing in the right ear She reports noticing symptoms for a year Ears are negative for impacted cerumen with no signs of infection and inflammation Normal whisper test in the right and left ear We will place a referral to ENT for follow evaluation

## 2023-01-06 LAB — CMP14+EGFR
ALT: 12 IU/L (ref 0–32)
AST: 12 IU/L (ref 0–40)
Albumin/Globulin Ratio: 1.8 (ref 1.2–2.2)
Albumin: 4.9 g/dL (ref 4.0–5.0)
Alkaline Phosphatase: 68 IU/L (ref 44–121)
BUN/Creatinine Ratio: 14 (ref 9–23)
BUN: 10 mg/dL (ref 6–20)
Bilirubin Total: 1 mg/dL (ref 0.0–1.2)
CO2: 19 mmol/L — ABNORMAL LOW (ref 20–29)
Calcium: 9.7 mg/dL (ref 8.7–10.2)
Chloride: 100 mmol/L (ref 96–106)
Creatinine, Ser: 0.72 mg/dL (ref 0.57–1.00)
Globulin, Total: 2.8 g/dL (ref 1.5–4.5)
Glucose: 84 mg/dL (ref 70–99)
Potassium: 3.9 mmol/L (ref 3.5–5.2)
Sodium: 137 mmol/L (ref 134–144)
Total Protein: 7.7 g/dL (ref 6.0–8.5)
eGFR: 120 mL/min/{1.73_m2} (ref >59)

## 2023-01-06 LAB — CBC WITH DIFFERENTIAL/PLATELET
Basophils Absolute: 0 10*3/uL (ref 0.0–0.2)
Basos: 1 %
EOS (ABSOLUTE): 0.1 10*3/uL (ref 0.0–0.4)
Eos: 3 %
Hematocrit: 36.3 % (ref 34.0–46.6)
Hemoglobin: 12.8 g/dL (ref 11.1–15.9)
Immature Grans (Abs): 0 10*3/uL (ref 0.0–0.1)
Immature Granulocytes: 0 %
Lymphocytes Absolute: 1.5 10*3/uL (ref 0.7–3.1)
Lymphs: 50 %
MCH: 32.5 pg (ref 26.6–33.0)
MCHC: 35.3 g/dL (ref 31.5–35.7)
MCV: 92 fL (ref 79–97)
Monocytes Absolute: 0.2 10*3/uL (ref 0.1–0.9)
Monocytes: 6 %
Neutrophils Absolute: 1.2 10*3/uL — ABNORMAL LOW (ref 1.4–7.0)
Neutrophils: 40 %
Platelets: 273 10*3/uL (ref 150–450)
RBC: 3.94 x10E6/uL (ref 3.77–5.28)
RDW: 11.4 % — ABNORMAL LOW (ref 11.7–15.4)
WBC: 3 10*3/uL — ABNORMAL LOW (ref 3.4–10.8)

## 2023-01-06 LAB — HIV ANTIBODY (ROUTINE TESTING W REFLEX): HIV Screen 4th Generation wRfx: NONREACTIVE

## 2023-01-06 LAB — HEMOGLOBIN A1C
Est. average glucose Bld gHb Est-mCnc: 94 mg/dL
Hgb A1c MFr Bld: 4.9 % (ref 4.8–5.6)

## 2023-01-06 LAB — LIPID PANEL
Chol/HDL Ratio: 2.6 ratio (ref 0.0–4.4)
Cholesterol, Total: 170 mg/dL (ref 100–199)
HDL: 65 mg/dL (ref >39)
LDL Chol Calc (NIH): 93 mg/dL (ref 0–99)
Triglycerides: 64 mg/dL (ref 0–149)
VLDL Cholesterol Cal: 12 mg/dL (ref 5–40)

## 2023-01-06 LAB — VITAMIN D 25 HYDROXY (VIT D DEFICIENCY, FRACTURES): Vit D, 25-Hydroxy: 23.8 ng/mL — ABNORMAL LOW (ref 30.0–100.0)

## 2023-01-06 LAB — TSH: TSH: 1.19 u[IU]/mL (ref 0.450–4.500)

## 2023-01-06 LAB — HEPATITIS C ANTIBODY: Hep C Virus Ab: NONREACTIVE

## 2023-01-10 LAB — CHLAMYDIA TRACHOMATIS, DNA, AMP PROBE: Chlamydia trachomatis, NAA: NEGATIVE

## 2023-01-13 ENCOUNTER — Encounter: Payer: Self-pay | Admitting: Family Medicine

## 2023-01-13 ENCOUNTER — Other Ambulatory Visit (HOSPITAL_COMMUNITY)
Admission: RE | Admit: 2023-01-13 | Discharge: 2023-01-13 | Disposition: A | Discharge status: Home / Self Care | Payer: Managed Care, Other (non HMO) | Source: Ambulatory Visit | Attending: Family Medicine | Admitting: Family Medicine

## 2023-01-13 ENCOUNTER — Ambulatory Visit: Payer: Managed Care, Other (non HMO) | Admitting: Family Medicine

## 2023-01-13 VITALS — BP 122/76 | HR 94 | Ht 65.0 in | Wt 134.0 lb

## 2023-01-13 DIAGNOSIS — J3089 Other allergic rhinitis: Secondary | ICD-10-CM | POA: Diagnosis not present

## 2023-01-13 DIAGNOSIS — G479 Sleep disorder, unspecified: Secondary | ICD-10-CM

## 2023-01-13 DIAGNOSIS — F419 Anxiety disorder, unspecified: Secondary | ICD-10-CM | POA: Diagnosis not present

## 2023-01-13 DIAGNOSIS — Z124 Encounter for screening for malignant neoplasm of cervix: Secondary | ICD-10-CM | POA: Insufficient documentation

## 2023-01-13 DIAGNOSIS — F32A Depression, unspecified: Secondary | ICD-10-CM

## 2023-01-13 MED ORDER — HYDROXYZINE PAMOATE 25 MG PO CAPS: 25.0000 mg | ORAL_CAPSULE | Freq: Every evening | ORAL | 0 refills | Status: DC | PRN

## 2023-01-13 NOTE — Progress Notes (Signed)
Established Patient Office Visit  Subjective:  Patient ID: Janet Lynch, female    DOB: 2001-05-06  Age: 22 y.o. MRN: [id number]  CC:  Chief Complaint  Patient presents with   Gynecologic Exam    Pap today.   Follow-up    2 week f/u, would like to discuss allergy options, states she would like to try a new medication, also having sleeping difficulty.     HPI Janet Lynch is a 22 y.o. female with past medical history of dysmenorrhagia, anxiety presents for Pap smear examination.  Past Medical History:  Diagnosis Date   Allergy    Left knee pain    Tympanic membrane perforation 10/2012   right    Past Surgical History:  Procedure Laterality Date   KNEE SURGERY Left    TYMPANOPLASTY  11/21/2012   Procedure: TYMPANOPLASTY;  Surgeon: Ascencion Dike, MD;  Location: [facility];  Service: ENT;  Laterality: Right;  with fascia graft    TYMPANOSTOMY TUBE PLACEMENT  age 18    Family History  Problem Relation Age of Onset   Cancer Other    Heart disease Other     Social History   Socioeconomic History   Marital status: Single    Spouse name: Not on file   Number of children: Not on file   Years of education: JR at [employer] education level: Some college, no degree  Occupational History   Not on file  Tobacco Use   Smoking status: Never   Smokeless tobacco: Never  Vaping Use   Vaping Use: Never used  Substance and Sexual Activity   Alcohol use: No    Comment: occ   Drug use: No   Sexual activity: Yes    Birth control/protection: None  Other Topics Concern   Not on file  Social History Narrative   Not on file   Social Determinants of Health   Financial Resource Strain: Not on file  Food Insecurity: Not on file  Transportation Needs: Not on file  Physical Activity: Not on file  Stress: Not on file  Social Connections: Not on file  Intimate Partner Violence: Not on file    Outpatient Medications Prior to Visit   Medication Sig Dispense Refill   montelukast (SINGULAIR) 10 MG tablet Take 1 tablet (10 mg total) by mouth at bedtime. 30 tablet 1   No facility-administered medications prior to visit.    No Known Allergies  ROS Review of Systems  Constitutional:  Negative for chills and fever.  Eyes:  Negative for visual disturbance.  Respiratory:  Negative for chest tightness and shortness of breath.   Genitourinary:  Negative for decreased urine volume, menstrual problem, vaginal bleeding and vaginal discharge.  Neurological:  Negative for dizziness and headaches.  Psychiatric/Behavioral:  Positive for sleep disturbance.       Objective:    Physical Exam HENT:     Head: Normocephalic.     Mouth/Throat:     Mouth: Mucous membranes are moist.  Cardiovascular:     Rate and Rhythm: Normal rate.     Heart sounds: Normal heart sounds.  Pulmonary:     Effort: Pulmonary effort is normal.     Breath sounds: Normal breath sounds.  Genitourinary:    Exam position: Lithotomy position.     Tanner stage (genital): 5.     Comments: Vaginal wall: pink and rugated, smooth and non-tender; absence of lesions, edema, and erythema. Labia Majora and Minora:  present bilaterally, moist, soft tissue, and homogeneous; free of edema and ulcerations. Clitoris is anatomically present, above the urethral, and free of lesions, masses, and ulceration.    Neurological:     Mental Status: She is alert.     BP 122/76   Pulse 94   Ht 5' 5"$  (1.651 m)   Wt 134 lb (60.8 kg)   SpO2 97%   BMI 22.30 kg/m  Wt Readings from Last 3 Encounters:  01/13/23 134 lb (60.8 kg)  12/10/22 135 lb 0.6 oz (61.3 kg)  11/20/22 136 lb (61.7 kg)    Lab Results  Component Value Date   TSH 1.190 01/05/2023   Lab Results  Component Value Date   WBC 3.0 (L) 01/05/2023   HGB 12.8 01/05/2023   HCT 36.3 01/05/2023   MCV 92 01/05/2023   PLT 273 01/05/2023   Lab Results  Component Value Date   NA 137 01/05/2023   K 3.9  01/05/2023   CO2 19 (L) 01/05/2023   GLUCOSE 84 01/05/2023   BUN 10 01/05/2023   CREATININE 0.72 01/05/2023   BILITOT 1.0 01/05/2023   ALKPHOS 68 01/05/2023   AST 12 01/05/2023   ALT 12 01/05/2023   PROT 7.7 01/05/2023   ALBUMIN 4.9 01/05/2023   CALCIUM 9.7 01/05/2023   ANIONGAP 8 10/29/2020   EGFR 120 01/05/2023   Lab Results  Component Value Date   CHOL 170 01/05/2023   Lab Results  Component Value Date   HDL 65 01/05/2023   Lab Results  Component Value Date   LDLCALC 93 01/05/2023   Lab Results  Component Value Date   TRIG 64 01/05/2023   Lab Results  Component Value Date   CHOLHDL 2.6 01/05/2023   Lab Results  Component Value Date   HGBA1C 4.9 01/05/2023      Assessment & Plan:  Cervical cancer screening Assessment & Plan: -Cytology and HPV co-testing (preferred) every 5 years or cytology alone (acceptable) every 3 years. - Pap due 2027    Orders: -     Cytology - PAP  Sleep disturbances -     hydrOXYzine Pamoate; Take 1 capsule (25 mg total) by mouth at bedtime as needed.  Dispense: 30 capsule; Refill: 0  Perennial allergic rhinitis Assessment & Plan: Reports minimal relief of her symptoms with Singulair 10 mg daily She complains of sleep disturbance today Will discontinue Singulair and start patient on hydroxyzine 25 mg to take at bedtime We will follow up on the referral placed to the allergist   Anxiety and depression Assessment & Plan: PHQ-9 is 27 Denies suicidal thoughts and ideation Will start patient on hydroxyzine 25 mg and follow-up of her symptoms in a month      Follow-up: Return in about 1 month (around 02/11/2023) for anxiety and depression.   Alvira Monday, FNP

## 2023-01-13 NOTE — Patient Instructions (Addendum)
I appreciate the opportunity to provide care to you today!    Follow up:  1 months  [place] [phone]     Please continue to a heart-healthy diet and increase your physical activities. Try to exercise for 59mns at least five times a week.      It was a pleasure to see you and I look forward to continuing to work together on your health and well-being. Please do not hesitate to call the office if you need care or have questions about your care.   Have a wonderful day and week. With Gratitude, GAlvira MondayMSN, FNP-BC

## 2023-01-13 NOTE — Assessment & Plan Note (Signed)
-  Cytology and HPV co-testing (preferred) every 5 years or cytology alone (acceptable) every 3 years. - Pap due 2027

## 2023-01-13 NOTE — Progress Notes (Signed)
I recommend increasing your intake of dietary vitamin D.  Your thyroid, kidney, and liver function are stable.  You tested negative for hep C and HIV

## 2023-01-13 NOTE — Assessment & Plan Note (Addendum)
Reports minimal relief of her symptoms with Singulair 10 mg daily She complains of sleep disturbance today Will discontinue Singulair and start patient on hydroxyzine 25 mg to take at bedtime We will follow up on the referral placed to the allergist

## 2023-01-13 NOTE — Assessment & Plan Note (Signed)
PHQ-9 is 27 Denies suicidal thoughts and ideation Will start patient on hydroxyzine 25 mg and follow-up of her symptoms in a month

## 2023-01-18 ENCOUNTER — Telehealth: Payer: Self-pay | Admitting: Family Medicine

## 2023-01-18 LAB — CYTOLOGY - PAP
Chlamydia: NEGATIVE
Comment: NEGATIVE
Comment: NEGATIVE
Comment: NORMAL
Diagnosis: NEGATIVE
Neisseria Gonorrhea: NEGATIVE
Trichomonas: NEGATIVE

## 2023-01-18 NOTE — Telephone Encounter (Signed)
Patient returning nurse call

## 2023-01-18 NOTE — Progress Notes (Signed)
Please inform the patient that her pap smear was negative for abnormal growth or malignancy on her cervix.  Please inform the patient that she tested negative for gonorrhea, chlamydia,  and trichomonas

## 2023-01-19 NOTE — Telephone Encounter (Signed)
Called back left vm with pap results.

## 2023-01-21 ENCOUNTER — Encounter: Payer: Self-pay | Admitting: Radiology

## 2023-02-05 ENCOUNTER — Other Ambulatory Visit: Payer: Self-pay

## 2023-02-05 ENCOUNTER — Ambulatory Visit: Payer: Managed Care, Other (non HMO) | Admitting: Allergy & Immunology

## 2023-02-05 ENCOUNTER — Encounter: Payer: Self-pay | Admitting: Allergy & Immunology

## 2023-02-05 VITALS — BP 100/60 | HR 109 | Temp 98.7°F | Resp 18 | Ht 66.0 in | Wt 134.5 lb

## 2023-02-05 DIAGNOSIS — J3089 Other allergic rhinitis: Secondary | ICD-10-CM | POA: Diagnosis not present

## 2023-02-05 DIAGNOSIS — K219 Gastro-esophageal reflux disease without esophagitis: Secondary | ICD-10-CM

## 2023-02-05 DIAGNOSIS — H7291 Unspecified perforation of tympanic membrane, right ear: Secondary | ICD-10-CM

## 2023-02-05 DIAGNOSIS — J302 Other seasonal allergic rhinitis: Secondary | ICD-10-CM

## 2023-02-05 MED ORDER — RYALTRIS 665-25 MCG/ACT NA SUSP: 2.0000 | Freq: Two times a day (BID) | NASAL | 5 refills | Status: AC

## 2023-02-05 MED ORDER — FAMOTIDINE 20 MG PO TABS: 20.0000 mg | ORAL_TABLET | Freq: Two times a day (BID) | ORAL | 5 refills | Status: DC

## 2023-02-05 MED ORDER — LEVOCETIRIZINE DIHYDROCHLORIDE 5 MG PO TABS: 5.0000 mg | ORAL_TABLET | Freq: Two times a day (BID) | ORAL | 5 refills | Status: DC

## 2023-02-05 NOTE — Progress Notes (Addendum)
NEW PATIENT  Date of Service/Encounter:  02/05/23  Consult requested by: Gilmore Laroche, FNP   Assessment:   Seasonal and perennial allergic rhinitis  Perforated ear drum, right  GERD   Plan/Recommendations:   1. Seasonal and perennial allergic rhinitis - Testing today showed: grasses, ragweed, weeds, trees, outdoor molds, dust mites, cat, dog, cockroach, and mixed feathers - Copy of test results provided.  - Avoidance measures provided. - Stop taking: all of your current medications - Start taking: Xyzal (levocetirizine) 5mg  tablet TWICE daily and Ryaltris (olopatadine/mometasone) two sprays per nostril 1-2 times daily as needed - We are sending the Ryaltris to a speciality pharmacy (call us if this is too expensive and we can make some changes over the phone) - You can use an extra dose of the antihistamine, if needed, for breakthrough symptoms.  - Consider nasal saline rinses 1-2 times daily to remove allergens from the nasal cavities as well as help with mucous clearance (this is especially helpful to do before the nasal sprays are given) - Strongly consider allergy shots as a means of long-term control. - Allergy shots "re-train" and "reset" the immune system to ignore environmental allergens and decrease the resulting immune response to those allergens (sneezing, itchy watery eyes, runny nose, nasal congestion, etc).    - Allergy shots improve symptoms in 75-85% of patients.  - We can discuss more at the next appointment if the medications are not working for you.   2. Perforated ear drum, right - We are going to send you to see Dr. Donata Clay at [facility] (part of [facility]). - Hopefully she will be able to get this all sorted out for you.  3. GERD - Refills sent in for famotidine BID. - Discussed limiting PPIs given the risk of calcium deficiency with long term use  4. Return in about 3 months (around 05/08/2023). You can have the follow up appointment with  Dr. Dellis Anes or a Nurse Practicioner (our Nurse Practitioners are excellent and always have Physician oversight!).      This note in its entirety was forwarded to the Provider who requested this consultation.  Subjective:   Janet Lynch is a 22 y.o. female presenting today for evaluation of  Chief Complaint  Patient presents with   Allergic Rhinitis     Terrible allergies-cough,sneezing, itchy. Certain smells cause nose and throat to itch    Other    Terrible tonsils stones, tonsils swell often    Sinusitis    Gets sinus infections     Janet Lynch has a history of the following: Patient Active Problem List   Diagnosis Date Noted   Cervical cancer screening 01/13/2023   Perennial allergic rhinitis 12/10/2022   Hx of tympanostomy 12/10/2022   Anxiety and depression 12/10/2022   Dysmenorrhea 12/18/2020   Breast lump or mass 12/18/2020   Breast tenderness 12/18/2020   Pain in joint, lower leg 07/06/2012   Stiffness of joint, not elsewhere classified, lower leg 07/06/2012   Abnormality of gait 07/06/2012   Patellar dislocation 07/06/2012    History obtained from: chart review and patient. She does not have a good story with her name. Alternatives include Samuella Bruin and a few others.   Janet Lynch was referred by Gilmore Laroche, FNP.     [name] is a 22 y.o. female presenting for an evaluation of headaches and allergies.   She has issues with temperature changes. She report sthat she has tonsil stones all of the time. She  is having a lot of eye itching. She reports that she starts choking randomly. But she also has some ear itching and sinus pain and pressure. She has never been allergy tested in the past.   She is currently using a combination of medications. She has been on Allegra and Zyrtec and Claritin and Xyzal. She had montelukast prescribed in the beginning of February. She did not feel much of a difference. She has never seen ENT, but she did have some  ear surgery a "while back". She tells me that her ear drum was ruptured and they had to repair it with some skin.   She loves to garden and has trouble when she is outdoors. She took this up during the pandemic. She went to [employer] and then moved back here to work at [employer]. She is doing a two year commitment with [employer] and she is liking it thus far.   She did get antibiotics 4 times per year, but now twice per year. She otherwise does not get antibiotics at all. She does tell me that her parents both have allergies as well. Otherwise she does not get antibiotics routinely at all.   GERD Symptom History: She takes famotidine.  She also has omeprazole to use as needed. She would like some refills of her famotidine. She has a lovely way of saying this.   Otherwise, there is no history of other atopic diseases, including drug allergies, stinging insect allergies, or contact dermatitis. There is no significant infectious history. Vaccinations are up to date.    Past Medical History: Patient Active Problem List   Diagnosis Date Noted   Cervical cancer screening 01/13/2023   Perennial allergic rhinitis 12/10/2022   Hx of tympanostomy 12/10/2022   Anxiety and depression 12/10/2022   Dysmenorrhea 12/18/2020   Breast lump or mass 12/18/2020   Breast tenderness 12/18/2020   Pain in joint, lower leg 07/06/2012   Stiffness of joint, not elsewhere classified, lower leg 07/06/2012   Abnormality of gait 07/06/2012   Patellar dislocation 07/06/2012    Medication List:  Allergies as of 02/05/2023   No Known Allergies      Medication List        Accurate as of February 05, 2023 11:59 PM. If you have any questions, ask your nurse or doctor.          famotidine 20 MG tablet Commonly known as: Pepcid Take 1 tablet (20 mg total) by mouth 2 (two) times daily. Started by: Alfonse Spruce, MD   hydrOXYzine 25 MG capsule Commonly known as: VISTARIL Take 1 capsule (25 mg total) by  mouth at bedtime as needed.   levocetirizine 5 MG tablet Commonly known as: XYZAL Take 1 tablet (5 mg total) by mouth in the morning and at bedtime. Started by: Alfonse Spruce, MD   montelukast 10 MG tablet Commonly known as: SINGULAIR Take 1 tablet (10 mg total) by mouth at bedtime.   Ryaltris 811-91 MCG/ACT Susp Generic drug: Olopatadine-Mometasone Place 2 sprays into the nose 2 (two) times daily. Started by: Alfonse Spruce, MD        Birth History: non-contributory  Developmental History: non-contributory  Past Surgical History: Past Surgical History:  Procedure Laterality Date   KNEE SURGERY Left    TYMPANOPLASTY  11/21/2012   Procedure: TYMPANOPLASTY;  Surgeon: Darletta Moll, MD;  Location: [facility];  Service: ENT;  Laterality: Right;  with fascia graft    TYMPANOSTOMY TUBE PLACEMENT  age  2     Family History: Family History  Problem Relation Age of Onset   Cancer Other    Heart disease Other    Asthma Cousin    Eczema Cousin      Social History: Dixie lives at home with her family. That is 23 years old.  There is wood in the main living areas of [place] in the bedrooms.  They have electric heating and central cooling.  There are no animals inside or outside of the home.  Suspect is on the bed, but not the pillows.  There is tobacco exposure  There are in the home as well as the car.  She currently works as a Engineer, production in the past 9 months.  There is no fume, chemical, or dust exposure.  She is not a smoker herself.   Review of Systems  Constitutional: Negative.  Negative for chills, fever, malaise/fatigue and weight loss.  HENT:  Positive for congestion, ear pain and sinus pain. Negative for ear discharge.   Eyes:  Negative for pain, discharge and redness.  Respiratory:  Negative for cough, sputum production, shortness of breath and wheezing.   Cardiovascular: Negative.  Negative for chest pain and palpitations.   Gastrointestinal:  Negative for abdominal pain, constipation, diarrhea, heartburn, nausea and vomiting.  Skin: Negative.  Negative for itching and rash.  Neurological:  Negative for dizziness and headaches.  Endo/Heme/Allergies:  Positive for environmental allergies. Does not bruise/bleed easily.       Objective:   Blood pressure 100/60, pulse (!) 109, temperature 98.7 F (37.1 C), resp. rate 18, height 5\' 6"  (1.676 m), weight 134 lb 8 oz (61 kg), SpO2 95 %. Body mass index is 21.71 kg/m.     Physical Exam Constitutional:      Appearance: She is well-developed.  HENT:     Head: Normocephalic and atraumatic.     Right Ear: Ear canal and external ear normal. No drainage, swelling or tenderness. Tympanic membrane is perforated. Tympanic membrane is not injected, scarred, erythematous, retracted or bulging.     Left Ear: Tympanic membrane, ear canal and external ear normal. No drainage, swelling or tenderness. Tympanic membrane is not injected, scarred, erythematous, retracted or bulging.     Nose: No nasal deformity, septal deviation, mucosal edema or rhinorrhea.     Right Turbinates: Enlarged, swollen and pale.     Left Turbinates: Enlarged, swollen and pale.     Right Sinus: No maxillary sinus tenderness or frontal sinus tenderness.     Left Sinus: No maxillary sinus tenderness or frontal sinus tenderness.     Comments: No nasal polyps noted.    Mouth/Throat:     Lips: Pink.     Mouth: Mucous membranes are moist. Mucous membranes are not pale and not dry.     Pharynx: Uvula midline.     Comments: Moderate cobblestoning appreciated.  Eyes:     General: Allergic shiner present.        Right eye: No discharge or hordeolum.        Left eye: No discharge or hordeolum.     Conjunctiva/sclera: Conjunctivae normal.     Right eye: Right conjunctiva is not injected. No chemosis.    Left eye: Left conjunctiva is not injected. No chemosis.    Pupils: Pupils are equal, round, and  reactive to light.  Cardiovascular:     Rate and Rhythm: Normal rate and regular rhythm.     Heart sounds: Normal heart sounds.  Pulmonary:  Effort: Pulmonary effort is normal. No tachypnea, accessory muscle usage or respiratory distress.     Breath sounds: Normal breath sounds. No wheezing, rhonchi or rales.  Chest:     Chest wall: No tenderness.  Abdominal:     Tenderness: There is no abdominal tenderness. There is no guarding or rebound.  Lymphadenopathy:     Head:     Right side of head: No submandibular, tonsillar or occipital adenopathy.     Left side of head: No submandibular, tonsillar or occipital adenopathy.     Cervical: No cervical adenopathy.  Skin:    Coloration: Skin is not pale.     Findings: No abrasion, erythema, petechiae or rash. Rash is not papular, urticarial or vesicular.  Neurological:     Mental Status: She is alert.      Diagnostic studies:   Allergy Studies:     Airborne Adult Perc - 02/05/23 1532     Time Antigen Placed 1532    Allergen Manufacturer Waynette Buttery    Location Back    Number of Test 59    1. Control-Buffer 50% Glycerol Negative    3. Albumin saline Negative    4. Bahia 4+    5. [place] 4+    6. Johnson 4+    7. Kentucky Blue 4+    8. Meadow Fescue 4+    9. Perennial Rye 4+    10. Sweet Vernal 3+    11. Timothy 4+    12. Cocklebur Negative    13. Burweed Marshelder Negative    14. Ragweed, short Negative    15. Ragweed, Giant Negative    16. Plantain,  English 2+    17. Lamb's Quarters 2+    18. Sheep Sorrell Negative    19. Rough Pigweed 2+    20. Marsh Elder, Rough 2+    21. Mugwort, Common Negative    22. Ash mix 2+    23. Birch mix 2+    24. Beech American 3+    25. Box, Elder 3+    26. Cedar, red Negative    27. Cottonwood, Guinea-Bissau Negative    28. Elm mix 2+    29. Hickory 2+    30. Maple mix 2+    31. Oak, Guinea[place] mix 4+    32. Pecan Pollen 3+    33. Pine mix Negative    34. Sycamore Eastern Negative    35.  Walnut, Black Pollen Negative    36. Alternaria alternata 3+    37. Cladosporium Herbarum Negative    38. Aspergillus mix Negative    39. Penicillium mix Negative    40. Bipolaris sorokiniana (Helminthosporium) 2+    41. Drechslera spicifera (Curvularia) 2+    42. Mucor plumbeus 2+    43. Fusarium moniliforme Negative    44. Aureobasidium pullulans (pullulara) Negative    45. Rhizopus oryzae Negative    46. Botrytis cinera Negative    47. Epicoccum nigrum Negative    48. Phoma betae Negative    49. Candida Albicans Negative    50. Trichophyton mentagrophytes Negative    51. Mite, D Farinae  5,000 AU/ml 2+    52. Mite, D Pteronyssinus  5,000 AU/ml 2+    53. Cat Hair 10,000 BAU/ml 3+    54.  Dog Epithelia 2+    55. Mixed Feathers 3+    56. Horse Epithelia Negative    57. Cockroach, German 2+    58. Mouse Negative    59.  Tobacco Leaf Negative             Intradermal - 02/05/23 1558     Time Antigen Placed 1558    Allergen Manufacturer Waynette Buttery    Location Arm    Number of Test 4    Control Negative    Ragweed mix 4+    Mold 2 Negative    Mold 4 Negative             Allergy testing results were read and interpreted by myself, documented by clinical staff.         Malachi Bonds, MD [facility]

## 2023-02-05 NOTE — Patient Instructions (Addendum)
1. Seasonal and perennial allergic rhinitis - Testing today showed: grasses, ragweed, weeds, trees, outdoor molds, dust mites, cat, dog, cockroach, and mixed feathers - Copy of test results provided.  - Avoidance measures provided. - Stop taking: all of your current medications - Start taking: Xyzal (levocetirizine) 5mg  tablet TWICE daily and Ryaltris (olopatadine/mometasone) two sprays per nostril 1-2 times daily as needed - We are sending the Ryaltris to a speciality pharmacy (call us if this is too expensive and we can make some changes over the phone) - You can use an extra dose of the antihistamine, if needed, for breakthrough symptoms.  - Consider nasal saline rinses 1-2 times daily to remove allergens from the nasal cavities as well as help with mucous clearance (this is especially helpful to do before the nasal sprays are given) - Strongly consider allergy shots as a means of long-term control. - Allergy shots "re-train" and "reset" the immune system to ignore environmental allergens and decrease the resulting immune response to those allergens (sneezing, itchy watery eyes, runny nose, nasal congestion, etc).    - Allergy shots improve symptoms in 75-85% of patients.  - We can discuss more at the next appointment if the medications are not working for you.   2. Perforated ear drum, right - We are going to send you to see Dr. Nicholaus Bloom at [facility] (part of [facility]). - Hopefully she will be able to get this all sorted out for you.  3. Return in about 3 months (around 05/08/2023). You can have the follow up appointment with Dr. Ernst Bowler or a Nurse Practicioner (our Nurse Practitioners are excellent and always have Physician oversight!).    Please inform us of any Emergency Department visits, hospitalizations, or changes in symptoms. Call us before going to the ED for breathing or allergy symptoms since we might be able to fit you in for a sick visit. Feel free to contact us  anytime with any questions, problems, or concerns.  It was a pleasure to meet you today! You are such a hoot!   Websites that have reliable patient information: 1. American Academy of Asthma, Allergy, and Immunology: www.aaaai.org 2. Food Allergy Research and Education (FARE): foodallergy.org 3. Mothers of Asthmatics: http://www.asthmacommunitynetwork.org 4. American College of Allergy, Asthma, and Immunology: www.acaai.org   COVID-19 Vaccine Information can be found at: [email or web address] For questions related to vaccine distribution or appointments, please email [email or web address] or call [phone].   We realize that you might be concerned about having an allergic reaction to the COVID19 vaccines. To help with that concern, WE ARE OFFERING THE COVID19 VACCINES IN OUR OFFICE! Ask the front desk for dates!     "Like" [place] on Facebook and Instagram for our latest updates!      A healthy democracy works best when [employer] participate! Make sure you are registered to vote! If you have moved or changed any of your contact information, you will need to get this updated before voting!  In some cases, you MAY be able to register to vote online: [email or web address]       Airborne Adult Perc - 02/05/23 1532     Time Antigen Placed [place] Lavella Hammock    Location Back    Number of Test 59    1. Control-Buffer 50% Glycerol Negative    3. Albumin saline Negative    4. [place] 4+    5. [place] 4+    6. [place] 4+    7. [place] Blue 4+  8. Meadow Fescue 4+    9. Perennial Rye 4+    10. Sweet Vernal 3+    11. Timothy 4+    12. Cocklebur Negative    13. Burweed Marshelder Negative    14. Ragweed, short Negative    15. Ragweed, Giant Negative    16. Plantain,  English 2+    17. Lamb's Quarters 2+    18. Sheep Sorrell Negative    19. Rough Pigweed 2+    20. Marsh Elder,  Rough 2+    21. Mugwort, Common Negative    22. Ash mix 2+    23. Birch mix 2+    24. Beech American 3+    25. Box, Elder 3+    26. Cedar, red Negative    27. Cottonwood, [place] Negative    28. Elm mix 2+    29. Hickory 2+    30. Maple mix 2+    31. Oak, [place] mix 4+    32. Pecan Pollen 3+    33. Pine mix Negative    34. Sycamore Eastern Negative    35. [place], Black Pollen Negative    36. Alternaria alternata 3+    37. Cladosporium Herbarum Negative    38. Aspergillus mix Negative    39. Penicillium mix Negative    40. Bipolaris sorokiniana (Helminthosporium) 2+    41. Drechslera spicifera (Curvularia) 2+    42. Mucor plumbeus 2+    43. Fusarium moniliforme Negative    44. Aureobasidium pullulans (pullulara) Negative    45. Rhizopus oryzae Negative    46. Botrytis cinera Negative    47. Epicoccum nigrum Negative    48. Phoma betae Negative    49. Candida Albicans Negative    50. Trichophyton mentagrophytes Negative    51. Mite, D Farinae  5,000 AU/ml 2+    52. Mite, D Pteronyssinus  5,000 AU/ml 2+    53. Cat Hair 10,000 BAU/ml 3+    54.  Dog Epithelia 2+    55. Mixed Feathers 3+    56. Horse Epithelia Negative    57. Cockroach, German 2+    58. Mouse Negative    59. Tobacco Leaf Negative             Intradermal - 02/05/23 1558     Time Antigen Placed 1558    Allergen Manufacturer Lavella Hammock    Location Arm    Number of Test 4    Control Negative    Ragweed mix 4+    Mold 2 Negative    Mold 4 Negative             Reducing Pollen Exposure  The American Academy of Allergy, Asthma and Immunology suggests the following steps to reduce your exposure to pollen during allergy seasons.    Do not hang sheets or clothing out to dry; pollen may collect on these items. Do not mow lawns or spend time around freshly cut grass; mowing stirs up pollen. Keep windows closed at night.  Keep car windows closed while driving. Minimize morning activities outdoors, a time  when pollen counts are usually at their highest. Stay indoors as much as possible when pollen counts or humidity is high and on windy days when pollen tends to remain in the air longer. Use air conditioning when possible.  Many air conditioners have filters that trap the pollen spores. Use a HEPA room air filter to remove pollen form the indoor air you breathe.  Control of Mold Allergen  Mold and fungi can grow on a variety of surfaces provided certain temperature and moisture conditions exist.  Outdoor molds grow on plants, decaying vegetation and soil.  The major outdoor mold, Alternaria and Cladosporium, are found in very high numbers during hot and dry conditions.  Generally, a late Summer - Fall peak is seen for common outdoor fungal spores.  Rain will temporarily lower outdoor mold spore count, but counts rise rapidly when the rainy period ends.  The most important indoor molds are Aspergillus and Penicillium.  Dark, humid and poorly ventilated basements are ideal sites for mold growth.  The next most common sites of mold growth are the bathroom and the kitchen.  Outdoor (Seasonal) Mold Control  Positive outdoor molds via skin testing: Alternaria, Bipolaris (Helminthsporium), Drechslera (Curvalaria), and Mucor  Use air conditioning and keep windows closed Avoid exposure to decaying vegetation. Avoid leaf raking. Avoid grain handling. Consider wearing a face mask if working in moldy areas.      Control of Dog or Cat Allergen  Avoidance is the best way to manage a dog or cat allergy. If you have a dog or cat and are allergic to dog or cats, consider removing the dog or cat from the home. If you have a dog or cat but don't want to find it a new home, or if your family wants a pet even though someone in the household is allergic, here are some strategies that may help keep symptoms at bay:  Keep the pet out of your bedroom and restrict it to only a few rooms. Be advised that keeping the  dog or cat in only one room will not limit the allergens to that room. Don't pet, hug or kiss the dog or cat; if you do, wash your hands with soap and water. High-efficiency particulate air (HEPA) cleaners run continuously in a bedroom or living room can reduce allergen levels over time. Regular use of a high-efficiency vacuum cleaner or a central vacuum can reduce allergen levels. Giving your dog or cat a bath at least once a week can reduce airborne allergen.  Control of Cockroach Allergen  Cockroach allergen has been identified as an important cause of acute attacks of asthma, especially in urban settings.  There are fifty-five species of cockroach that exist in the [place], however only three, the [place], [place] species produce allergen that can affect patients with Asthma.  Allergens can be obtained from fecal particles, egg casings and secretions from cockroaches.    Remove food sources. Reduce access to water. Seal access and entry points. Spray runways with 0.5-1% Diazinon or Chlorpyrifos Blow boric acid power under stoves and refrigerator. Place bait stations (hydramethylnon) at feeding sites.  Allergy Shots  Allergies are the result of a chain reaction that starts in the immune system. Your immune system controls how your body defends itself. For instance, if you have an allergy to pollen, your immune system identifies pollen as an invader or allergen. Your immune system overreacts by producing antibodies called Immunoglobulin E (IgE). These antibodies travel to cells that release chemicals, causing an allergic reaction.  The concept behind allergy immunotherapy, whether it is received in the form of shots or tablets, is that the immune system can be desensitized to specific allergens that trigger allergy symptoms. Although it requires time and patience, the payback can be long-term relief. Allergy injections contain a dilute solution of those substances that you  are allergic to based upon your skin testing and allergy  history.   How Do Allergy Shots Work?  Allergy shots work much like a vaccine. Your body responds to injected amounts of a particular allergen given in increasing doses, eventually developing a resistance and tolerance to it. Allergy shots can lead to decreased, minimal or no allergy symptoms.  There generally are two phases: build-up and maintenance. Build-up often ranges from three to six months and involves receiving injections with increasing amounts of the allergens. The shots are typically given once or twice a week, though more rapid build-up schedules are sometimes used.  The maintenance phase begins when the most effective dose is reached. This dose is different for each person, depending on how allergic you are and your response to the build-up injections. Once the maintenance dose is reached, there are longer periods between injections, typically two to four weeks.  Occasionally doctors give cortisone-type shots that can temporarily reduce allergy symptoms. These types of shots are different and should not be confused with allergy immunotherapy shots.  Who Can Be Treated with Allergy Shots?  Allergy shots may be a good treatment approach for people with allergic rhinitis (hay fever), allergic asthma, conjunctivitis (eye allergy) or stinging insect allergy.   Before deciding to begin allergy shots, you should consider:   The length of allergy season and the severity of your symptoms  Whether medications and/or changes to your environment can control your symptoms  Your desire to avoid long-term medication use  Time: allergy immunotherapy requires a major time commitment  Cost: may vary depending on your insurance coverage  Allergy shots for children age 57 and older are effective and often well tolerated. They might prevent the onset of new allergen sensitivities or the progression to asthma.  Allergy shots are not started  on patients who are pregnant but can be continued on patients who become pregnant while receiving them. In some patients with other medical conditions or who take certain common medications, allergy shots may be of risk. It is important to mention other medications you talk to your allergist.   What are the two types of build-ups offered:   RUSH or Rapid Desensitization -- one day of injections lasting from 8:30-4:30pm, injections every 1 hour.  Approximately half of the build-up process is completed in that one day.  The following week, normal build-up is resumed, and this entails ~16 visits either weekly or twice weekly, until reaching your "maintenance dose" which is continued weekly until eventually getting spaced out to every month for a duration of 3 to 5 years. The regular build-up appointments are nurse visits where the injections are administered, followed by required monitoring for 30 minutes.    Traditional build-up -- weekly visits for 6 -12 months until reaching "maintenance dose", then continue weekly until eventually spacing out to every 4 weeks as above. At these appointments, the injections are administered, followed by required monitoring for 30 minutes.     Either way is acceptable, and both are equally effective. With the rush protocol, the advantage is that less time is spent here for injections overall AND you would also reach maintenance dosing faster (which is when the clinical benefit starts to become more apparent). Not everyone is a candidate for rapid desensitization.   IF we proceed with the RUSH protocol, there are premedications which must be taken the day before and the day after the rush only (this includes antihistamines, steroids, and Singulair).  After the rush day, no prednisone or Singulair is required, and we just recommend antihistamines taken  on your injection day.  What Is An Estimate of the Costs?  If you are interested in starting allergy injections, please  check with your insurance company about your coverage for both allergy vial sets and allergy injections.  Please do so prior to making the appointment to start injections.  The following are CPT codes to give to your insurance company. These are the amounts we BILL to the insurance company, but the amount YOU WILL PAY and [place] and depends on the contracts we have with different insurance companies.   Amount Billed to Insurance One allergy vial set  CPT 95165   $ 1200     Two allergy vial set  CPT 95165   $ 2400     Three allergy vial set  CPT 95165   $ 3600     One injection   CPT 95115   $ 35  Two injections   CPT 95117   $ 40 RUSH (Rapid Desensitization) CPT 95180 x 8 hours $500/hour  Regarding the allergy injections, your co-pay may or may not apply with each injection, so please confirm this with your insurance company. When you start allergy injections, 1 or 2 sets of vials are made based on your allergies.  Not all patients can be on one set of vials. A set of vials lasts 6 months to a year depending on how quickly you can proceed with your build-up of your allergy injections. Vials are personalized for each patient depending on their specific allergens.  How often are allergy injection given during the build-up period?   Injections are given at least weekly during the build-up period until your maintenance dose is achieved. Per the doctor's discretion, you may have the option of getting allergy injections two times per week during the build-up period. However, there must be at least 48 hours between injections. The build-up period is usually completed within 6-12 months depending on your ability to schedule injections and for adjustments for reactions. When maintenance dose is reached, your injection schedule is gradually changed to every two weeks and later to every three weeks. Injections will then continue every 4 weeks. Usually, injections are continued for a  total of 3-5 years.   When Will I Feel Better?  Some may experience decreased allergy symptoms during the build-up phase. For others, it may take as long as 12 months on the maintenance dose. If there is no improvement after a year of maintenance, your allergist will discuss other treatment options with you.  If you aren't responding to allergy shots, it may be because there is not enough dose of the allergen in your vaccine or there are missing allergens that were not identified during your allergy testing. Other reasons could be that there are high levels of the allergen in your environment or major exposure to non-allergic triggers like tobacco smoke.  What Is the Length of Treatment?  Once the maintenance dose is reached, allergy shots are generally continued for three to five years. The decision to stop should be discussed with your allergist at that time. Some people may experience a permanent reduction of allergy symptoms. Others may relapse and a longer course of allergy shots can be considered.  What Are the Possible Reactions?  The two types of adverse reactions that can occur with allergy shots are local and systemic. Common local reactions include very mild redness and swelling at the injection site, which can happen immediately or several hours after. Report a  delayed reaction from your last injection. These include arm swelling or runny nose, watery eyes or cough that occurs within 12-24 hours after injection. A systemic reaction, which is less common, affects the entire body or a particular body system. They are usually mild and typically respond quickly to medications. Signs include increased allergy symptoms such as sneezing, a stuffy nose or hives.   Rarely, a serious systemic reaction called anaphylaxis can develop. Symptoms include swelling in the throat, wheezing, a feeling of tightness in the chest, nausea or dizziness. Most serious systemic reactions develop within 30 minutes  of allergy shots. This is why it is strongly recommended you wait in your doctor's office for 30 minutes after your injections. Your allergist is trained to watch for reactions, and his or her staff is trained and equipped with the proper medications to identify and treat them.   Report to the nurse immediately if you experience any of the following symptoms: swelling, itching or redness of the skin, hives, watery eyes/nose, breathing difficulty, excessive sneezing, coughing, stomach pain, diarrhea, or light headedness. These symptoms may occur within 15-20 minutes after injection and may require medication.   Who Should Administer Allergy Shots?  The preferred location for receiving shots is your prescribing allergist's office. Injections can sometimes be given at another facility where the physician and staff are trained to recognize and treat reactions, and have received instructions by your prescribing allergist.  What if I am late for an injection?   Injection dose will be adjusted depending upon how many days or weeks you are late for your injection.   What if I am sick?   Please report any illness to the nurse before receiving injections. She may adjust your dose or postpone injections depending on your symptoms. If you have fever, flu, sinus infection or chest congestion it is best to postpone allergy injections until you are better. Never get an allergy injection if your asthma is causing you problems. If your symptoms persist, seek out medical care to get your health problem under control.  What If I am or Become Pregnant:  Women that become pregnant should schedule an appointment with [facility] [place] before receiving any further allergy injections.

## 2023-02-08 ENCOUNTER — Encounter: Payer: Self-pay | Admitting: Allergy & Immunology

## 2023-02-08 NOTE — Addendum Note (Signed)
Addended by: Valentina Shaggy on: 02/08/2023 08:32 AM   Modules accepted: Orders

## 2023-02-10 ENCOUNTER — Telehealth: Payer: Managed Care, Other (non HMO) | Admitting: Physician Assistant

## 2023-02-10 ENCOUNTER — Encounter: Payer: Self-pay | Admitting: Allergy & Immunology

## 2023-02-10 DIAGNOSIS — J019 Acute sinusitis, unspecified: Secondary | ICD-10-CM | POA: Diagnosis not present

## 2023-02-10 DIAGNOSIS — J302 Other seasonal allergic rhinitis: Secondary | ICD-10-CM

## 2023-02-10 DIAGNOSIS — B9689 Other specified bacterial agents as the cause of diseases classified elsewhere: Secondary | ICD-10-CM

## 2023-02-10 MED ORDER — AMOXICILLIN-POT CLAVULANATE 875-125 MG PO TABS: 1.0000 | ORAL_TABLET | Freq: Two times a day (BID) | ORAL | 0 refills | Status: DC

## 2023-02-10 NOTE — Progress Notes (Signed)

## 2023-02-11 ENCOUNTER — Encounter: Payer: Self-pay | Admitting: Family Medicine

## 2023-02-11 ENCOUNTER — Ambulatory Visit: Payer: Managed Care, Other (non HMO) | Admitting: Family Medicine

## 2023-02-17 ENCOUNTER — Ambulatory Visit: Payer: Managed Care, Other (non HMO) | Admitting: Family Medicine

## 2023-02-17 ENCOUNTER — Encounter: Payer: Self-pay | Admitting: Family Medicine

## 2023-03-02 ENCOUNTER — Ambulatory Visit: Payer: Managed Care, Other (non HMO) | Admitting: Family Medicine

## 2023-03-02 ENCOUNTER — Encounter: Payer: Self-pay | Admitting: Family Medicine

## 2023-03-02 VITALS — BP 103/69 | HR 107 | Ht 65.0 in | Wt 140.0 lb

## 2023-03-02 DIAGNOSIS — M545 Low back pain, unspecified: Secondary | ICD-10-CM | POA: Diagnosis not present

## 2023-03-02 DIAGNOSIS — F419 Anxiety disorder, unspecified: Secondary | ICD-10-CM | POA: Diagnosis not present

## 2023-03-02 DIAGNOSIS — F32A Depression, unspecified: Secondary | ICD-10-CM | POA: Diagnosis not present

## 2023-03-02 DIAGNOSIS — K219 Gastro-esophageal reflux disease without esophagitis: Secondary | ICD-10-CM | POA: Diagnosis not present

## 2023-03-02 MED ORDER — PANTOPRAZOLE SODIUM 20 MG PO TBEC: 20.0000 mg | DELAYED_RELEASE_TABLET | Freq: Every day | ORAL | 3 refills | Status: DC

## 2023-03-02 NOTE — Assessment & Plan Note (Signed)
Onset of symptoms for about a month Reports following up with a chiropractor in March 2024 Imaging studies of her lumbar spine showed a slight curvature She was recommended for an adjustment by chiropractor, but the patient declined Pain is rated in the clinic 2 out of 10 Patient noted to have  poor posture of her lumbar spine when sitting Encourage conservative management and maintaining proper posture of her back with prolonged sitting No symptoms of sciatica were reported Lumbar pain is not radiating Negative straight leg test in the clinic

## 2023-03-02 NOTE — Assessment & Plan Note (Signed)
Complains of epigastric pain at the sternum Pain is a burning sensation and nonradiating Symptom likely of GERD The patient has been taking Pepcid 20 mg twice daily with minimal relief of her symptoms Will discontinue Pepcid today and start the patient on Protonix 20 mg daily

## 2023-03-02 NOTE — Assessment & Plan Note (Signed)
GAD-7 is 4 PHQ-9 is 7 Reports not taking hydralazine since starting Xyzal 5 mg Denies suicidal thoughts and ideation

## 2023-03-02 NOTE — Patient Instructions (Signed)
I appreciate the opportunity to provide care to you today!    Follow up:  3 months   Please continue to a heart-healthy diet and increase your physical activities. Try to exercise for 30mins at least five days a week.      It was a pleasure to see you and I look forward to continuing to work together on your health and well-being. Please do not hesitate to call the office if you need care or have questions about your care.   Have a wonderful day and week. With Gratitude, Adalei Novell MSN, FNP-BC  

## 2023-03-02 NOTE — Progress Notes (Signed)
Established Patient Office Visit  Subjective:  Patient ID: Janet Lynch, female    DOB: 10-28-2001  Age: 22 y.o. MRN: [id number]  CC:  Chief Complaint  Patient presents with   Follow-up    1 month f/u.    HPI Janet Lynch is a 22 y.o. female  presents for anxiety f/u.  For the details of today's visit, please refer to the assessment and plan.    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    Past Surgical History:  Procedure Laterality Date   KNEE SURGERY Left    TYMPANOPLASTY  11/21/2012   Procedure: TYMPANOPLASTY;  Surgeon: Darletta Moll, MD;  Location: [facility];  Service: ENT;  Laterality: Right;  with fascia graft    TYMPANOSTOMY TUBE PLACEMENT  age 44    Family History  Problem Relation Age of Onset   Cancer Other    Heart disease Other    Asthma Cousin    Eczema Cousin     Social History   Socioeconomic History   Marital status: Single    Spouse name: Not on file   Number of children: Not on file   Years of education: JR at [employer] education level: Some college, no degree  Occupational History   Not on file  Tobacco Use   Smoking status: Never    Passive exposure: Current   Smokeless tobacco: Never  Vaping Use   Vaping Use: Never used  Substance and Sexual Activity   Alcohol use: No    Comment: occ   Drug use: No   Sexual activity: Yes    Birth control/protection: None  Other Topics Concern   Not on file  Social History Narrative   Not on file   Social Determinants of Health   Financial Resource Strain: Not on file  Food Insecurity: Not on file  Transportation Needs: Not on file  Physical Activity: Not on file  Stress: Not on file  Social Connections: Not on file  Intimate Partner Violence: Not on file    Outpatient Medications Prior to Visit  Medication Sig Dispense Refill   hydrOXYzine (VISTARIL) 25 MG  capsule Take 1 capsule (25 mg total) by mouth at bedtime as needed. 30 capsule 0   levocetirizine (XYZAL) 5 MG tablet Take 1 tablet (5 mg total) by mouth in the morning and at bedtime. 60 tablet 5   montelukast (SINGULAIR) 10 MG tablet Take 1 tablet (10 mg total) by mouth at bedtime. 30 tablet 1   Olopatadine-Mometasone (RYALTRIS) 665-25 MCG/ACT SUSP Place 2 sprays into the nose 2 (two) times daily. 29 g 5   amoxicillin-clavulanate (AUGMENTIN) 875-125 MG tablet Take 1 tablet by mouth 2 (two) times daily. 14 tablet 0   famotidine (PEPCID) 20 MG tablet Take 1 tablet (20 mg total) by mouth 2 (two) times daily. 60 tablet 5   No facility-administered medications prior to visit.    No Known Allergies  ROS Review of Systems  Constitutional:  Negative for chills and fever.  Eyes:  Negative for visual disturbance.  Respiratory:  Negative for chest tightness and shortness of breath.   Neurological:  Negative for dizziness and headaches.      Objective:    Physical Exam HENT:     Head: Normocephalic.     Mouth/Throat:     Mouth:  Mucous membranes are moist.  Cardiovascular:     Rate and Rhythm: Normal rate.     Heart sounds: Normal heart sounds.  Pulmonary:     Effort: Pulmonary effort is normal.     Breath sounds: Normal breath sounds.  Neurological:     Mental Status: She is alert.     BP 103/69   Pulse (!) 107   Ht 5\' 5"  (1.651 m)   Wt 140 lb 0.6 oz (63.5 kg)   SpO2 95%   BMI 23.30 kg/m  Wt Readings from Last 3 Encounters:  03/02/23 140 lb 0.6 oz (63.5 kg)  02/05/23 134 lb 8 oz (61 kg)  01/13/23 134 lb (60.8 kg)    Lab Results  Component Value Date   TSH 1.190 01/05/2023   Lab Results  Component Value Date   WBC 3.0 (L) 01/05/2023   HGB 12.8 01/05/2023   HCT 36.3 01/05/2023   MCV 92 01/05/2023   PLT 273 01/05/2023   Lab Results  Component Value Date   NA 137 01/05/2023   K 3.9 01/05/2023   CO2 19 (L) 01/05/2023   GLUCOSE 84 01/05/2023   BUN 10 01/05/2023    CREATININE 0.72 01/05/2023   BILITOT 1.0 01/05/2023   ALKPHOS 68 01/05/2023   AST 12 01/05/2023   ALT 12 01/05/2023   PROT 7.7 01/05/2023   ALBUMIN 4.9 01/05/2023   CALCIUM 9.7 01/05/2023   ANIONGAP 8 10/29/2020   EGFR 120 01/05/2023   Lab Results  Component Value Date   CHOL 170 01/05/2023   Lab Results  Component Value Date   HDL 65 01/05/2023   Lab Results  Component Value Date   LDLCALC 93 01/05/2023   Lab Results  Component Value Date   TRIG 64 01/05/2023   Lab Results  Component Value Date   CHOLHDL 2.6 01/05/2023   Lab Results  Component Value Date   HGBA1C 4.9 01/05/2023      Assessment & Plan:  Anxiety and depression Assessment & Plan: GAD-7 is 4 PHQ-9 is 7 Reports not taking hydralazine since starting Xyzal 5 mg Denies suicidal thoughts and ideation   Gastroesophageal reflux disease without esophagitis Assessment & Plan: Complains of epigastric pain at the sternum Pain is a burning sensation and nonradiating Symptom likely of GERD The patient has been taking Pepcid 20 mg twice daily with minimal relief of her symptoms Will discontinue Pepcid today and start the patient on Protonix 20 mg daily   Orders: -     Pantoprazole Sodium; Take 1 tablet (20 mg total) by mouth daily.  Dispense: 30 tablet; Refill: 3  Lumbar pain Assessment & Plan: Onset of symptoms for about a month Reports following up with a chiropractor in March 2024 Imaging studies of her lumbar spine showed a slight curvature She was recommended for an adjustment by chiropractor, but the patient declined Pain is rated in the clinic 2 out of 10 Patient noted to have  poor posture of her lumbar spine when sitting Encourage conservative management and maintaining proper posture of her back with prolonged sitting No symptoms of sciatica were reported Lumbar pain is not radiating Negative straight leg test in the clinic     Follow-up: Return in about 3 months (around  06/01/2023).   [place] Laroche, FNP

## 2023-03-09 ENCOUNTER — Encounter: Payer: Self-pay | Admitting: Family Medicine

## 2023-03-09 ENCOUNTER — Telehealth: Payer: Managed Care, Other (non HMO) | Admitting: Family Medicine

## 2023-03-09 DIAGNOSIS — R0981 Nasal congestion: Secondary | ICD-10-CM

## 2023-03-09 NOTE — Progress Notes (Signed)
Because recent acute bacterial sinus infection with antibiotic treatment, I feel your condition warrants further evaluation and I recommend that you be seen in a face to face visit.   NOTE: There will be NO CHARGE for this eVisit   If you are having a true medical emergency please call 911.      For an urgent face to face visit, [facility] has eight urgent care centers for your convenience:   NEW!! [facility] at [facility] Get Driving Directions [phone] [address], [address] [place], [address]    [facility] Center at [facility] Get Driving Directions [phone] [address] [place], [place] [address]   [facility] Health Urgent Care Center [facility]) Get Driving Directions [phone] [address] [place], [place] [address]  [facility] Health Urgent Care Center [facility] [place]) Get Driving Directions [phone] [address] [place],  [place]  [address]  [facility] Health Urgent Care Center [facility] - at [employer]  [phone] [address] [place],  [place] [address]   [facility] Health Urgent Care at [facility] Get Driving Directions [phone] [address] [place], [place] [address]   [facility] Health Urgent Care at [facility] Get Driving Directions  [phone] [address] [place], [place] [address]   [facility] Health Urgent Care at [facility] Directions [phone] [address] [place], [place] [address]  Your MyChart E-visit questionnaire answers were reviewed by a board certified advanced clinical practitioner to complete your personal care plan based on your specific symptoms.  Thank you for using e-Visits.

## 2023-03-14 ENCOUNTER — Other Ambulatory Visit: Payer: Self-pay | Admitting: Allergy & Immunology

## 2023-03-15 ENCOUNTER — Other Ambulatory Visit (HOSPITAL_COMMUNITY): Payer: Self-pay

## 2023-03-15 MED ORDER — LEVOCETIRIZINE DIHYDROCHLORIDE 5 MG PO TABS
5.0000 mg | ORAL_TABLET | Freq: Two times a day (BID) | ORAL | 5 refills | Status: DC
  Filled 2023-03-15 – 2023-04-19 (×2): qty 60, 30d supply, fill #0

## 2023-03-15 NOTE — Progress Notes (Signed)
VIALS NOT MADE UNTIL APPT SCHED. 

## 2023-03-15 NOTE — Addendum Note (Signed)
Addended by: Alfonse Spruce on: 03/15/2023 01:45 PM   Modules accepted: Orders

## 2023-03-15 NOTE — Progress Notes (Signed)
Aeroallergen Immunotherapy   Ordering Provider: Dr. Malachi Bonds   Patient Details  Name: Janet Lynch  MRN: [id number]  Date of Birth: 03-28-2001   Order 2 of 2   Vial Label: RW/Molds/DM/CR   0.3 ml (Volume)  1:20 Concentration -- Ragweed Mix  0.2 ml (Volume)  1:20 Concentration -- Alternaria alternata  0.2 ml (Volume)  1:20 Concentration -- Bipolaris sorokiniana  0.2 ml (Volume)  1:20 Concentration -- Drechslera spicifera  0.2 ml (Volume)  1:10 Concentration -- Mucor plumbeus  0.3 ml (Volume)  1:20 Concentration -- Cockroach, German  0.7 ml (Volume)   AU Concentration -- Mite Mix (DF 5,000 & DP 5,000)    2.1  ml Extract Subtotal  2.9  ml Diluent  5.0  ml Maintenance Total   Schedule:  B   Blue Vial (1:100,000): Schedule B (6 doses)  Yellow Vial (1:10,000): Schedule B (6 doses)  Green Vial (1:1,000): Schedule B (6 doses)  Red Vial (1:100): Schedule A (14 doses)   Special Instructions: none

## 2023-03-15 NOTE — Progress Notes (Signed)
Aeroallergen Immunotherapy   Ordering Provider: Dr. Malachi Bonds   Patient Details  Name: Janet Lynch  MRN: [id number]  Date of Birth: Aug 20, 2001   Order 1 of 2   Vial Label: G/W/T/C/D   0.4 ml (Volume)  BAU Concentration -- 7 Grass Mix* 100,000 ([address] [place], [place], [place], Perennial [place], RedTop, Sweet Vernal, Timothy)  0.5 ml (Volume)  1:20 Concentration -- Weed Mix*  0.7 ml (Volume)  1:20 Concentration -- Eastern 10 Tree Mix (also Sweet Gum)  0.2 ml (Volume)  1:20 Concentration -- Box Elder  0.2 ml (Volume)  1:10 Concentration -- Pecan Pollen  0.5 ml (Volume)  1:10 Concentration -- Cat Hair  0.5 ml (Volume)  1:10 Concentration -- Dog Epithelia    3.2  ml Extract Subtotal  2.0  ml Diluent  5.0  ml Maintenance Total   Schedule:  B   Blue Vial (1:100,000): Schedule B (6 doses)  Yellow Vial (1:10,000): Schedule B (6 doses)  Green Vial (1:1,000): Schedule B (6 doses)  Red Vial (1:100): Schedule A (14 doses)   Special Instructions: none

## 2023-03-16 ENCOUNTER — Other Ambulatory Visit (HOSPITAL_COMMUNITY): Payer: Self-pay

## 2023-03-24 ENCOUNTER — Other Ambulatory Visit (HOSPITAL_COMMUNITY): Payer: Self-pay

## 2023-04-14 ENCOUNTER — Ambulatory Visit: Payer: Managed Care, Other (non HMO) | Admitting: Family Medicine

## 2023-04-19 ENCOUNTER — Other Ambulatory Visit: Payer: Self-pay

## 2023-04-20 ENCOUNTER — Other Ambulatory Visit (HOSPITAL_COMMUNITY): Payer: Self-pay

## 2023-04-20 ENCOUNTER — Other Ambulatory Visit: Payer: Self-pay

## 2023-04-29 ENCOUNTER — Other Ambulatory Visit (HOSPITAL_COMMUNITY): Payer: Self-pay

## 2023-05-07 ENCOUNTER — Other Ambulatory Visit: Payer: Self-pay

## 2023-05-07 ENCOUNTER — Encounter: Payer: Self-pay | Admitting: Allergy & Immunology

## 2023-05-07 ENCOUNTER — Ambulatory Visit (INDEPENDENT_AMBULATORY_CARE_PROVIDER_SITE_OTHER): Payer: Managed Care, Other (non HMO) | Admitting: Allergy & Immunology

## 2023-05-07 VITALS — BP 126/62 | HR 99 | Temp 100.2°F | Resp 18 | Ht 65.25 in | Wt 145.8 lb

## 2023-05-07 DIAGNOSIS — K219 Gastro-esophageal reflux disease without esophagitis: Secondary | ICD-10-CM | POA: Diagnosis not present

## 2023-05-07 DIAGNOSIS — J3089 Other allergic rhinitis: Secondary | ICD-10-CM | POA: Diagnosis not present

## 2023-05-07 DIAGNOSIS — H7291 Unspecified perforation of tympanic membrane, right ear: Secondary | ICD-10-CM

## 2023-05-07 DIAGNOSIS — J302 Other seasonal allergic rhinitis: Secondary | ICD-10-CM

## 2023-05-07 MED ORDER — LEVOCETIRIZINE DIHYDROCHLORIDE 5 MG PO TABS: 5.0000 mg | ORAL_TABLET | Freq: Two times a day (BID) | ORAL | 5 refills | Status: AC

## 2023-05-07 NOTE — Progress Notes (Signed)
FOLLOW UP  Date of Service/Encounter:  05/07/23   Assessment:   Seasonal and perennial allergic rhinitis (grasses, ragweed, weeds, trees, outdoor molds, dust mites, cat, dog, cockroach, and mixed feathers)   Perforated ear drum, right - followed by Janet Lynch   GERD   Plan/Recommendations:   1. Seasonal and perennial allergic rhinitis - Previous testing showed: grasses, ragweed, weeds, trees, outdoor molds, dust mites, cat, dog, cockroach, and mixed feathers - Continue taking: Xyzal (levocetirizine) 5mg  tablet TWICE daily and Ryaltris (olopatadine/mometasone) two sprays per nostril 1-2 times daily as needed - We are sending the Ryaltris to a speciality pharmacy (call us if this is too expensive and we can make some changes over the phone) - You can use an extra dose of the antihistamine, if needed, for breakthrough symptoms.  - Consider nasal saline rinses 1-2 times daily to remove allergens from the nasal cavities as well as help with mucous clearance (this is especially helpful to do before the nasal sprays are given) - Strongly consider allergy shots as a means of long-term control. - Allergy shots "re-train" and "reset" the immune system to ignore environmental allergens and decrease the resulting immune response to those allergens (sneezing, itchy watery eyes, runny nose, nasal congestion, etc).    - Allergy shots improve symptoms in 75-85% of patients.  - Make an appointment to start shots in the fall.   2. Perforated ear drum, right - At least you are connected with an ENT right now.   3. Return in about 3 months (around 08/07/2023) for FOOD ALLERGY TESTING. Make an appointment to start shots in mid August.    Subjective:   Janet Lynch is a 22 y.o. female presenting today for follow up of  Chief Complaint  Patient presents with   Allergic Rhinitis     Says she is ok. Still flaring up when she is around animals.     Janet Lynch has a history of the  following: Patient Active Problem List   Diagnosis Date Noted   Lumbar pain 03/02/2023   GERD (gastroesophageal reflux disease) 03/02/2023   Cervical cancer screening 01/13/2023   Perennial allergic rhinitis 12/10/2022   Hx of tympanostomy 12/10/2022   Anxiety and depression 12/10/2022   Dysmenorrhea 12/18/2020   Breast lump or mass 12/18/2020   Breast tenderness 12/18/2020   Pain in joint, lower leg 07/06/2012   Stiffness of joint, not elsewhere classified, lower leg 07/06/2012   Abnormality of gait 07/06/2012   Patellar dislocation 07/06/2012    History obtained from: chart review and patient.  Janet Lynch is a 22 y.o. female presenting for a follow up visit.  She was last seen in March 2024.  At that time, she had testing that was positive to multiple indoor and outdoor allergens.  We stopped all of her current meds and started Xyzal and Ryaltris 2 sprays per nostril up to twice daily.  We did talk about doing allergy shots.  She did have a perforated right eardrum.  We referred her to see Janet Lynch at [facility].  For her GERD, we continue with clonidine twice a day.  Since the last visit, she has been pretty good.  Allergic Rhinitis Symptom History: She has been doing well with the medications aside from with her animals. She stayed with her cousin and she had sneezing with animal exposure.  She remains interested in allergy shots. She has to go [employer] for several weeks to train for her job. She will  need to start in the fall.   She did see ENT and they did not feel that another surgery would help. She was supposed to talk to them yesterday about possible surgery, but she cancelled because they did not fele that this would be helpful.   Food Allergy Symptom History: She is concerned with food allergies. She reports that she has some skin itching with milk. She reports that when she eats beef, she feels that she gets extra tired. She is not sure whether there is allergy or  something else.   She loves to garden and has trouble when she is outdoors. She took this up during the pandemic. She went to [facility] and then moved back here to work at [employer]. She is doing a two year commitment with [employer] and she is liking it thus far.  I did tell her about   Otherwise, there have been no changes to her past medical history, surgical history, family history, or social history.    Review of Systems  Constitutional: Negative.  Negative for chills, fever, malaise/fatigue and weight loss.  HENT:  Positive for congestion. Negative for ear discharge, ear pain and sinus pain.   Eyes:  Negative for pain, discharge and redness.  Respiratory:  Negative for cough, sputum production, shortness of breath and wheezing.   Cardiovascular: Negative.  Negative for chest pain, palpitations and orthopnea.  Gastrointestinal:  Negative for abdominal pain, constipation, diarrhea, heartburn, nausea and vomiting.  Skin: Negative.  Negative for itching and rash.  Neurological:  Negative for dizziness and headaches.  Endo/Heme/Allergies:  Positive for environmental allergies. Does not bruise/bleed easily.       Objective:   Blood pressure 126/62, pulse 99, temperature 100.2 F (37.9 C), temperature source Temporal, resp. rate 18, height 5' 5.25" (1.657 m), weight 145 lb 12.8 oz (66.1 kg), SpO2 99 %. Body mass index is 24.08 kg/m.    Physical Exam Vitals reviewed.  Constitutional:      Appearance: She is well-developed.  HENT:     Head: Normocephalic and atraumatic.     Right Ear: Tympanic membrane, ear canal and external ear normal. No drainage, swelling or tenderness. Tympanic membrane is not injected, scarred, perforated, erythematous, retracted or bulging.     Left Ear: Tympanic membrane, ear canal and external ear normal. No drainage, swelling or tenderness. Tympanic membrane is not injected, scarred, perforated, erythematous, retracted or bulging.     Nose: No nasal  deformity, septal deviation, mucosal edema or rhinorrhea.     Right Turbinates: Enlarged, swollen and pale.     Left Turbinates: Enlarged, swollen and pale.     Right Sinus: No maxillary sinus tenderness or frontal sinus tenderness.     Left Sinus: No maxillary sinus tenderness or frontal sinus tenderness.     Comments: No nasal polyps noted.    Mouth/Throat:     Lips: Pink.     Mouth: Mucous membranes are moist. Mucous membranes are not pale and not dry.     Pharynx: Uvula midline.     Comments: Moderate cobblestoning appreciated.  Eyes:     General: Allergic shiner present.        Right eye: No discharge or hordeolum.        Left eye: No discharge or hordeolum.     Conjunctiva/sclera: Conjunctivae normal.     Right eye: Right conjunctiva is not injected. No chemosis.    Left eye: Left conjunctiva is not injected. No chemosis.    Pupils: Pupils  are equal, round, and reactive to light.  Cardiovascular:     Rate and Rhythm: Normal rate and regular rhythm.     Heart sounds: Normal heart sounds.  Pulmonary:     Effort: Pulmonary effort is normal. No tachypnea, accessory muscle usage or respiratory distress.     Breath sounds: Normal breath sounds. No wheezing, rhonchi or rales.     Comments: Moving air well in all lung fields. No increased work of breathing noted.  Chest:     Chest wall: No tenderness.  Abdominal:     Tenderness: There is no abdominal tenderness. There is no guarding or rebound.  Lymphadenopathy:     Head:     Right side of head: No submandibular, tonsillar or occipital adenopathy.     Left side of head: No submandibular, tonsillar or occipital adenopathy.     Cervical: No cervical adenopathy.  Skin:    General: Skin is warm.     Capillary Refill: Capillary refill takes less than 2 seconds.     Coloration: Skin is not pale.     Findings: No abrasion, erythema, petechiae or rash. Rash is not papular, urticarial or vesicular.  Neurological:     Mental Status: She  is alert.  Psychiatric:        Behavior: Behavior is cooperative.      Diagnostic studies: none    Malachi Bonds, MD  [facility]

## 2023-05-07 NOTE — Patient Instructions (Addendum)
1. Seasonal and perennial allergic rhinitis - Previous testing showed: grasses, ragweed, weeds, trees, outdoor molds, dust mites, cat, dog, cockroach, and mixed feathers - Continue taking: Xyzal (levocetirizine) 5mg  tablet TWICE daily and Ryaltris (olopatadine/mometasone) two sprays per nostril 1-2 times daily as needed - We are sending the Ryaltris to a speciality pharmacy (call us if this is too expensive and we can make some changes over the phone) - You can use an extra dose of the antihistamine, if needed, for breakthrough symptoms.  - Consider nasal saline rinses 1-2 times daily to remove allergens from the nasal cavities as well as help with mucous clearance (this is especially helpful to do before the nasal sprays are given) - Strongly consider allergy shots as a means of long-term control. - Allergy shots "re-train" and "reset" the immune system to ignore environmental allergens and decrease the resulting immune response to those allergens (sneezing, itchy watery eyes, runny nose, nasal congestion, etc).    - Allergy shots improve symptoms in 75-85% of patients.  - Make an appointment to start shots in the fall.   2. Perforated ear drum, right - At least you are connected with an ENT right now.   3. Return in about 3 months (around 08/07/2023) for FOOD ALLERGY TESTING. Make an appointment to start shots in mid August.    Please inform us of any Emergency Department visits, hospitalizations, or changes in symptoms. Call us before going to the ED for breathing or allergy symptoms since we might be able to fit you in for a sick visit. Feel free to contact us anytime with any questions, problems, or concerns.  It was a pleasure to see you today! You are such a hoot!   Websites that have reliable patient information: 1. American Academy of Asthma, Allergy, and Immunology: www.aaaai.org 2. Food Allergy Research and Education (FARE): foodallergy.org 3. Mothers of Asthmatics:  http://www.asthmacommunitynetwork.org 4. American College of Allergy, Asthma, and Immunology: www.acaai.org   COVID-19 Vaccine Information can be found at: [email or web address] For questions related to vaccine distribution or appointments, please email [email or web address] or call [phone].   We realize that you might be concerned about having an allergic reaction to the COVID19 vaccines. To help with that concern, WE ARE OFFERING THE COVID19 VACCINES IN OUR OFFICE! Ask the front desk for dates!     "Like" [place] on Facebook and Instagram for our latest updates!      A healthy democracy works best when [employer] participate! Make sure you are registered to vote! If you have moved or changed any of your contact information, you will need to get this updated before voting!  In some cases, you MAY be able to register to vote online: [email or web address]       Allergy Shots  Allergies are the result of a chain reaction that starts in the immune system. Your immune system controls how your body defends itself. For instance, if you have an allergy to pollen, your immune system identifies pollen as an invader or allergen. Your immune system overreacts by producing antibodies called Immunoglobulin E (IgE). These antibodies travel to cells that release chemicals, causing an allergic reaction.  The concept behind allergy immunotherapy, whether it is received in the form of shots or tablets, is that the immune system can be desensitized to specific allergens that trigger allergy symptoms. Although it requires time and patience, the payback can be long-term relief. Allergy injections contain a dilute solution of those substances that you are allergic to based upon your  skin testing and allergy history.   How Do Allergy Shots Work?  Allergy shots work much like a vaccine. Your body responds to injected amounts  of a particular allergen given in increasing doses, eventually developing a resistance and tolerance to it. Allergy shots can lead to decreased, minimal or no allergy symptoms.  There generally are two phases: build-up and maintenance. Build-up often ranges from three to six months and involves receiving injections with increasing amounts of the allergens. The shots are typically given once or twice a week, though more rapid build-up schedules are sometimes used.  The maintenance phase begins when the most effective dose is reached. This dose is different for each person, depending on how allergic you are and your response to the build-up injections. Once the maintenance dose is reached, there are longer periods between injections, typically two to four weeks.  Occasionally doctors give cortisone-type shots that can temporarily reduce allergy symptoms. These types of shots are different and should not be confused with allergy immunotherapy shots.  Who Can Be Treated with Allergy Shots?  Allergy shots may be a good treatment approach for people with allergic rhinitis (hay fever), allergic asthma, conjunctivitis (eye allergy) or stinging insect allergy.   Before deciding to begin allergy shots, you should consider:   The length of allergy season and the severity of your symptoms  Whether medications and/or changes to your environment can control your symptoms  Your desire to avoid long-term medication use  Time: allergy immunotherapy requires a major time commitment  Cost: may vary depending on your insurance coverage  Allergy shots for children age 52 and older are effective and often well tolerated. They might prevent the onset of new allergen sensitivities or the progression to asthma.  Allergy shots are not started on patients who are pregnant but can be continued on patients who become pregnant while receiving them. In some patients with other medical conditions or who take certain common  medications, allergy shots may be of risk. It is important to mention other medications you talk to your allergist.   What are the two types of build-ups offered:   RUSH or Rapid Desensitization -- one day of injections lasting from 8:30-4:30pm, injections every 1 hour.  Approximately half of the build-up process is completed in that one day.  The following week, normal build-up is resumed, and this entails ~16 visits either weekly or twice weekly, until reaching your "maintenance dose" which is continued weekly until eventually getting spaced out to every month for a duration of 3 to 5 years. The regular build-up appointments are nurse visits where the injections are administered, followed by required monitoring for 30 minutes.    Traditional build-up -- weekly visits for 6 -12 months until reaching "maintenance dose", then continue weekly until eventually spacing out to every 4 weeks as above. At these appointments, the injections are administered, followed by required monitoring for 30 minutes.     Either way is acceptable, and both are equally effective. With the rush protocol, the advantage is that less time is spent here for injections overall AND you would also reach maintenance dosing faster (which is when the clinical benefit starts to become more apparent). Not everyone is a candidate for rapid desensitization.   IF we proceed with the RUSH protocol, there are premedications which must be taken the day before and the day after the rush only (this includes antihistamines, steroids, and Singulair).  After the rush day, no prednisone or Singulair is required, and  we just recommend antihistamines taken on your injection day.  What Is An Estimate of the Costs?  If you are interested in starting allergy injections, please check with your insurance company about your coverage for both allergy vial sets and allergy injections.  Please do so prior to making the appointment to start injections.  The  following are CPT codes to give to your insurance company. These are the amounts we BILL to the insurance company, but the amount YOU WILL PAY and WE RECEIVE IS SUBSTANTIALLY LESS and depends on the contracts we have with different insurance companies.   Amount Billed to Insurance One allergy vial set  CPT 95165   $ 1200     Two allergy vial set  CPT 95165   $ 2400     Three allergy vial set  CPT 95165   $ 3600     One injection   CPT 95115   $ 35  Two injections   CPT 95117   $ 40 RUSH (Rapid Desensitization) CPT 95180 x  hours $500/hour  Regarding the allergy injections, your co-pay may or may not apply with each injection, so please confirm this with your insurance company. When you start allergy injections, 1 or 2 sets of vials are made based on your allergies.  Not all patients can be on one set of vials. A set of vials lasts 6 months to a year depending on how quickly you can proceed with your build-up of your allergy injections. Vials are personalized for each patient depending on their specific allergens.  How often are allergy injection given during the build-up period?   Injections are given at least weekly during the build-up period until your maintenance dose is achieved. Per the doctor's discretion, you may have the option of getting allergy injections two times per week during the build-up period. However, there must be at least 48 hours between injections. The build-up period is usually completed within 6-12 months depending on your ability to schedule injections and for adjustments for reactions. When maintenance dose is reached, your injection schedule is gradually changed to every two weeks and later to every three weeks. Injections will then continue every 4 weeks. Usually, injections are continued for a total of 3-5 years.   When Will I Feel Better?  Some may experience decreased allergy symptoms during the build-up phase. For others, it may take as long as 12 months on the  maintenance dose. If there is no improvement after a year of maintenance, your allergist will discuss other treatment options with you.  If you aren't responding to allergy shots, it may be because there is not enough dose of the allergen in your vaccine or there are missing allergens that were not identified during your allergy testing. Other reasons could be that there are high levels of the allergen in your environment or major exposure to non-allergic triggers like tobacco smoke.  What Is the Length of Treatment?  Once the maintenance dose is reached, allergy shots are generally continued for three to five years. The decision to stop should be discussed with your allergist at that time. Some people may experience a permanent reduction of allergy symptoms. Others may relapse and a longer course of allergy shots can be considered.  What Are the Possible Reactions?  The two types of adverse reactions that can occur with allergy shots are local and systemic. Common local reactions include very mild redness and swelling at the injection site, which can happen immediately or  several hours after. Report a delayed reaction from your last injection. These include arm swelling or runny nose, watery eyes or cough that occurs within 12-24 hours after injection. A systemic reaction, which is less common, affects the entire body or a particular body system. They are usually mild and typically respond quickly to medications. Signs include increased allergy symptoms such as sneezing, a stuffy nose or hives.   Rarely, a serious systemic reaction called anaphylaxis can develop. Symptoms include swelling in the throat, wheezing, a feeling of tightness in the chest, nausea or dizziness. Most serious systemic reactions develop within 30 minutes of allergy shots. This is why it is strongly recommended you wait in your doctor's office for 30 minutes after your injections. Your allergist is trained to watch for reactions,  and his or her staff is trained and equipped with the proper medications to identify and treat them.   Report to the nurse immediately if you experience any of the following symptoms: swelling, itching or redness of the skin, hives, watery eyes/nose, breathing difficulty, excessive sneezing, coughing, stomach pain, diarrhea, or light headedness. These symptoms may occur within 15-20 minutes after injection and may require medication.   Who Should Administer Allergy Shots?  The preferred location for receiving shots is your prescribing allergist's office. Injections can sometimes be given at another facility where the physician and staff are trained to recognize and treat reactions, and have received instructions by your prescribing allergist.  What if I am late for an injection?   Injection dose will be adjusted depending upon how many days or weeks you are late for your injection.   What if I am sick?   Please report any illness to the nurse before receiving injections. She may adjust your dose or postpone injections depending on your symptoms. If you have fever, flu, sinus infection or chest congestion it is best to postpone allergy injections until you are better. Never get an allergy injection if your asthma is causing you problems. If your symptoms persist, seek out medical care to get your health problem under control.  What If I am or Become Pregnant:  Women that become pregnant should schedule an appointment with [facility] before receiving any further allergy injections.

## 2023-06-01 ENCOUNTER — Ambulatory Visit: Payer: Managed Care, Other (non HMO) | Admitting: Family Medicine

## 2023-06-29 ENCOUNTER — Ambulatory Visit: Payer: Managed Care, Other (non HMO) | Admitting: Family Medicine

## 2023-06-29 ENCOUNTER — Encounter: Payer: Self-pay | Admitting: Family Medicine

## 2023-06-29 VITALS — BP 111/73 | HR 70 | Ht 65.25 in | Wt 150.0 lb

## 2023-06-29 DIAGNOSIS — F419 Anxiety disorder, unspecified: Secondary | ICD-10-CM

## 2023-06-29 DIAGNOSIS — E559 Vitamin D deficiency, unspecified: Secondary | ICD-10-CM | POA: Diagnosis not present

## 2023-06-29 DIAGNOSIS — K219 Gastro-esophageal reflux disease without esophagitis: Secondary | ICD-10-CM | POA: Diagnosis not present

## 2023-06-29 DIAGNOSIS — R7301 Impaired fasting glucose: Secondary | ICD-10-CM | POA: Diagnosis not present

## 2023-06-29 DIAGNOSIS — E7849 Other hyperlipidemia: Secondary | ICD-10-CM

## 2023-06-29 DIAGNOSIS — F32A Depression, unspecified: Secondary | ICD-10-CM

## 2023-06-29 DIAGNOSIS — E038 Other specified hypothyroidism: Secondary | ICD-10-CM

## 2023-06-29 MED ORDER — PANTOPRAZOLE SODIUM 20 MG PO TBEC: 20.0000 mg | DELAYED_RELEASE_TABLET | Freq: Every day | ORAL | 3 refills | Status: DC

## 2023-06-29 NOTE — Patient Instructions (Signed)
I appreciate the opportunity to provide care to you today!    Follow up:  5 months  Labs: please stop by the lab 2-3 days before your next appointment today to get your blood drawn (CBC, CMP, TSH, Lipid profile, HgA1c, Vit D)   Attached with your AVS, you will find valuable resources for self-education. I highly recommend dedicating some time to thoroughly examine them.   Please continue to a heart-healthy diet and increase your physical activities. Try to exercise for at least five days a week.    It was a pleasure to see you and I look forward to continuing to work together on your health and well-being. Please do not hesitate to call the office if you need care or have questions about your care.  In case of emergency, please visit the Emergency Department for urgent care, or contact our clinic at [phone] to schedule an appointment. We're here to help you!   Have a wonderful day and week. With Gratitude, Gilmore Laroche MSN, FNP-BC

## 2023-06-29 NOTE — Progress Notes (Signed)
Established Patient Office Visit  Subjective:  Patient ID: Janet Lynch, female    DOB: 05/17/2001  Age: 22 y.o. MRN: [id number]  CC:  Chief Complaint  Patient presents with   Care Management    3 month f/u, needs a refill on acid reflux     HPI Janet Lynch is a 22 y.o. female with past medical history of GERD, anxiety and depression presents for f/u of  chronic medical conditions. For the details of today's visit, please refer to the assessment and plan.     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    Past Surgical History:  Procedure Laterality Date   KNEE SURGERY Left    TYMPANOPLASTY  11/21/2012   Procedure: TYMPANOPLASTY;  Surgeon: Darletta Moll, MD;  Location: [facility];  Service: ENT;  Laterality: Right;  with fascia graft    TYMPANOSTOMY TUBE PLACEMENT  age 3    Family History  Problem Relation Age of Onset   Cancer Other    Heart disease Other    Asthma Cousin    Eczema Cousin     Social History   Socioeconomic History   Marital status: Single    Spouse name: Not on file   Number of children: Not on file   Years of education: JR at [employer] education level: Some college, no degree  Occupational History   Not on file  Tobacco Use   Smoking status: Never    Passive exposure: Current   Smokeless tobacco: Never  Vaping Use   Vaping status: Never Used  Substance and Sexual Activity   Alcohol use: Yes    Comment: occ   Drug use: No   Sexual activity: Yes    Birth control/protection: None  Other Topics Concern   Not on file  Social History Narrative   Not on file   Social Determinants of Health   Financial Resource Strain: Not on file  Food Insecurity: No Food Insecurity (11/02/2019)   Received from [facility], [facility]   Hunger Vital Sign    Worried About Running Out of Food in the Last Year: Never true     Ran Out of Food in the Last Year: Never true  Transportation Needs: No Transportation Needs (11/02/2019)   Received from [facility], [facility]   [facility] - Transportation    Lack of Transportation (Medical): No    Lack of Transportation (Non-Medical): No  Physical Activity: Not on file  Stress: No Stress Concern Present (11/02/2019)   Received from [facility], [facility]   [facility] - Occupational Stress Questionnaire    Feeling of Stress : Not at all  Social Connections: Not on file  Intimate Partner Violence: Not At Risk (11/02/2019)   Received from [facility], [facility]   Humiliation, Afraid, Rape, and Kick questionnaire    Fear of Current or Ex-Partner: No    Emotionally Abused: No    Physically Abused: No    Sexually Abused: No    Outpatient Medications Prior to Visit  Medication Sig Dispense Refill   levocetirizine (XYZAL) 5 MG tablet Take 1 tablet (5 mg total) by mouth in the morning and at bedtime. 60 tablet 5   RYALTRIS 665-25 MCG/ACT SUSP Place 2 sprays into  the nose 2 times daily at 12 noon and 4 pm.     pantoprazole (PROTONIX) 20 MG tablet Take 1 tablet (20 mg total) by mouth daily. 30 tablet 3   No facility-administered medications prior to visit.    No Known Allergies  ROS Review of Systems  Constitutional:  Negative for chills and fever.  Eyes:  Negative for visual disturbance.  Respiratory:  Negative for chest tightness and shortness of breath.   Neurological:  Negative for dizziness and headaches.      Objective:    Physical Exam HENT:     Head: Normocephalic.     Mouth/Throat:     Mouth: Mucous membranes are moist.  Cardiovascular:     Rate and Rhythm: Normal rate.     Heart sounds: Normal heart sounds.  Pulmonary:     Effort: Pulmonary effort is normal.     Breath sounds: Normal breath sounds.  Neurological:     Mental Status: She is alert.     BP 111/73   Pulse 70   Ht 5'  5.25" (1.657 m)   Wt 150 lb 0.6 oz (68.1 kg)   SpO2 95%   BMI 24.78 kg/m  Wt Readings from Last 3 Encounters:  06/29/23 150 lb 0.6 oz (68.1 kg)  05/07/23 145 lb 12.8 oz (66.1 kg)  03/02/23 140 lb 0.6 oz (63.5 kg)    Lab Results  Component Value Date   TSH 1.190 01/05/2023   Lab Results  Component Value Date   WBC 3.0 (L) 01/05/2023   HGB 12.8 01/05/2023   HCT 36.3 01/05/2023   MCV 92 01/05/2023   PLT 273 01/05/2023   Lab Results  Component Value Date   NA 137 01/05/2023   K 3.9 01/05/2023   CO2 19 (L) 01/05/2023   GLUCOSE 84 01/05/2023   BUN 10 01/05/2023   CREATININE 0.72 01/05/2023   BILITOT 1.0 01/05/2023   ALKPHOS 68 01/05/2023   AST 12 01/05/2023   ALT 12 01/05/2023   PROT 7.7 01/05/2023   ALBUMIN 4.9 01/05/2023   CALCIUM 9.7 01/05/2023   ANIONGAP 8 10/29/2020   EGFR 120 01/05/2023   Lab Results  Component Value Date   CHOL 170 01/05/2023   Lab Results  Component Value Date   HDL 65 01/05/2023   Lab Results  Component Value Date   LDLCALC 93 01/05/2023   Lab Results  Component Value Date   TRIG 64 01/05/2023   Lab Results  Component Value Date   CHOLHDL 2.6 01/05/2023   Lab Results  Component Value Date   HGBA1C 4.9 01/05/2023      Assessment & Plan:  Gastroesophageal reflux disease without esophagitis Assessment & Plan: Protonix 20 mg daily sent to pharmacy GERD diet encouraged  Orders: -     Pantoprazole Sodium; Take 1 tablet (20 mg total) by mouth daily.  Dispense: 30 tablet; Refill: 3  Anxiety and depression Assessment & Plan: Denies suicidal thoughts and ideation Reviewed nonpharmacologic management of anxiety and depression  Mindfulness and Meditation Practices like mindfulness meditation can help reduce symptoms by promoting relaxation and present-moment awareness.  Exercise  Regular physical activity has been shown to improve mood and reduce anxiety through the release of endorphins and other neurochemicals.  Healthy  Diet Eating a balanced diet rich in fruits, vegetables, whole grains, and lean proteins can support overall mental health.  Sleep Hygiene  Establishing a regular sleep routine and ensuring good sleep quality can significantly impact mood and anxiety levels.  Stress Management Techniques Activities such as yoga, tai chi, and deep breathing exercises can help manage stress.  Social Support Maintaining strong relationships and seeking support from friends, family, or support groups can provide emotional comfort and reduce feelings of isolation.  Lifestyle Modifications Reducing alcohol and caffeine intake, quitting smoking, and avoiding recreational drugs can improve symptoms.  Art and Music Therapy Engaging in creative activities like painting, drawing, or playing music can be therapeutic and help express emotions.  Light Therapy Particularly useful for seasonal affective disorder (SAD), exposure to bright light can help regulate mood. .    IFG (impaired fasting glucose) -     Hemoglobin A1c  Vitamin D deficiency -     VITAMIN D 25 Hydroxy (Vit-D Deficiency, Fractures)  Other specified hypothyroidism -     TSH + free T4  Other hyperlipidemia -     Lipid panel -     CMP14+EGFR -     CBC with Differential/Platelet  Note: This chart has been completed using Engineer, civil (consulting) software, and while attempts have been made to ensure accuracy, certain words and phrases may not be transcribed as intended.    Follow-up: Return in about 5 months (around 11/29/2023).   Gilmore Laroche, FNP

## 2023-06-29 NOTE — Assessment & Plan Note (Signed)
Denies suicidal thoughts and ideation Reviewed nonpharmacologic management of anxiety and depression  Mindfulness and Meditation Practices like mindfulness meditation can help reduce symptoms by promoting relaxation and present-moment awareness.  Exercise  Regular physical activity has been shown to improve mood and reduce anxiety through the release of endorphins and other neurochemicals.  Healthy Diet Eating a balanced diet rich in fruits, vegetables, whole grains, and lean proteins can support overall mental health.  Sleep Hygiene  Establishing a regular sleep routine and ensuring good sleep quality can significantly impact mood and anxiety levels.  Stress Management Techniques Activities such as yoga, tai chi, and deep breathing exercises can help manage stress.  Social Support Maintaining strong relationships and seeking support from friends, family, or support groups can provide emotional comfort and reduce feelings of isolation.  Lifestyle Modifications Reducing alcohol and caffeine intake, quitting smoking, and avoiding recreational drugs can improve symptoms.  Art and Music Therapy Engaging in creative activities like painting, drawing, or playing music can be therapeutic and help express emotions.  Light Therapy Particularly useful for seasonal affective disorder (SAD), exposure to bright light can help regulate mood. Marland Kitchen

## 2023-06-29 NOTE — Assessment & Plan Note (Addendum)
Protonix 20 mg daily sent to pharmacy GERD diet encouraged

## 2023-08-06 ENCOUNTER — Ambulatory Visit: Payer: Managed Care, Other (non HMO) | Admitting: Allergy & Immunology

## 2023-11-10 ENCOUNTER — Ambulatory Visit: Payer: Managed Care, Other (non HMO) | Admitting: Family Medicine

## 2023-11-10 ENCOUNTER — Encounter: Payer: Self-pay | Admitting: Family Medicine

## 2023-11-10 VITALS — BP 116/69 | HR 84 | Ht 62.5 in | Wt 148.1 lb

## 2023-11-10 DIAGNOSIS — F410 Panic disorder [episodic paroxysmal anxiety] without agoraphobia: Secondary | ICD-10-CM

## 2023-11-10 MED ORDER — HYDROXYZINE PAMOATE 25 MG PO CAPS
25.0000 mg | ORAL_CAPSULE | Freq: Three times a day (TID) | ORAL | 1 refills | Status: DC | PRN
Start: 2023-11-10 — End: 2024-03-20

## 2023-11-10 NOTE — Patient Instructions (Addendum)
I appreciate the opportunity to provide care to you today!    Follow up:  2 weeks for knee concerns  Labs: please stop by the lab today to get your blood drawn (CBC, CMP, TSH, Lipid profile, HgA1c, Vit D)  Panic Attacks For panic attacks, I recommend starting hydroxyzine 25 mg every 8 hours as needed. This medication can help alleviate symptoms of anxiety and panic attacks.  Nonpharmacological interventions for panic attacks focus on strategies to manage anxiety, prevent escalation, and promote relaxation. Here are several effective approaches:  1. Breathing Exercises: Deep Breathing: Practicing slow, deep breathing helps calm the nervous system. One effective technique is the 4-7-8 method, where you inhale for 4 seconds, hold your breath for 7 seconds, and exhale slowly for 8 seconds. Diaphragmatic Breathing: Also called abdominal or belly breathing, this involves breathing deeply into the diaphragm rather than shallow chest breathing, helping to reduce tension and anxiety. 2. Mindfulness and Grounding Techniques: Mindfulness Meditation: Focusing on the present moment through mindfulness helps reduce the intensity of panic attacks. Guided meditation can be helpful, or simply paying attention to your breath and surroundings without judgment. 5-4-3-2-1 Grounding Technique: This method involves identifying 5 things you can see, 4 things you can touch, 3 things you can hear, 2 things you can smell, and 1 thing you can taste. It helps shift focus away from the panic and ground you in the present. 3. Cognitive Behavioral Techniques: Cognitive Restructuring: This involves identifying and challenging irrational thoughts that may contribute to panic attacks. Replacing catastrophic thinking with more balanced, realistic thoughts can reduce anxiety. Reality Testing: Remind yourself that panic attacks, while uncomfortable, are not life-threatening and will eventually pass. 4. Progressive Muscle Relaxation  (PMR): PMR involves systematically tensing and relaxing muscle groups in the body to reduce physical tension and stress. By focusing on the sensation of relaxation, it helps divert attention from panic symptoms and calms the body. 5. Exercise and Physical Activity: Regular exercise, such as walking, jogging, or yoga, helps reduce overall anxiety levels and can prevent the build-up of tension that might lead to panic attacks. Yoga and tai chi incorporate both physical activity and mindfulness, which can help manage anxiety and promote relaxation. 6. Lifestyle Modifications: Consistent Sleep Schedule: Ensuring adequate and restful sleep can help reduce overall anxiety and prevent the triggers of panic attacks. Balanced Diet: Maintaining a healthy, well-balanced diet, and avoiding excessive caffeine, alcohol, or sugar can help manage stress and prevent panic symptoms. Reducing Stress: Practice stress-management techniques such as time management, relaxation exercises, and avoiding overcommitment to reduce general anxiety. 7. Self-Soothing Techniques: Soothing Sounds or Music: Listening to calming music or nature sounds can help reduce anxiety and soothe the mind during or after a panic attack. Aromatherapy: Essential oils like lavender or chamomile can have a calming effect and may help alleviate the intensity of panic symptoms. 8. Support Systems: Talking to Someone: Speaking with a supportive friend, family member, or therapist during or after an attack can provide comfort and help manage feelings of isolation or fear. Support Groups: Joining a support group for those with anxiety or panic disorders can offer shared experiences and coping strategies, helping individuals feel less alone.   Attached with your AVS, you will find valuable resources for self-education. I highly recommend dedicating some time to thoroughly examine them.   Please continue to a heart-healthy diet and increase your physical  activities. Try to exercise for at least five days a week.    It was a  pleasure to see you and I look forward to continuing to work together on your health and well-being. Please do not hesitate to call the office if you need care or have questions about your care.  In case of emergency, please visit the Emergency Department for urgent care, or contact our clinic at [phone] to schedule an appointment. We're here to help you!   Have a wonderful day and week. With Gratitude, Gilmore Laroche MSN, FNP-BC

## 2023-11-10 NOTE — Progress Notes (Signed)
Established Patient Office Visit  Subjective:  Patient ID: Janet Lynch, female    DOB: [birth year]  Age: 22 y.o. MRN: [id number]  CC:  Chief Complaint  Patient presents with   Care Management    Pt reports missing an appt, did not get her labs from last visit will get that today. Has concerns about anxiety, states when in public places feels like she is going to pass out, states she has ear dx of ruptured in her ear on her right side unsure if this could be related to her ear/balance instead of anxiety, has questions about this.     HPI Janet Lynch is a 22 y.o. female with past medical history of GERD, anxiety and depression presents for f/u of  chronic medical conditions. For the details of today's visit, please refer to the assessment and plan.   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    Past Surgical History:  Procedure Laterality Date   KNEE SURGERY Left    TYMPANOPLASTY  11/21/2012   Procedure: TYMPANOPLASTY;  Surgeon: Darletta Moll, MD;  Location: [facility];  Service: ENT;  Laterality: Right;  with fascia graft    TYMPANOSTOMY TUBE PLACEMENT  age 75    Family History  Problem Relation Age of Onset   Cancer Other    Heart disease Other    Asthma Cousin    Eczema Cousin     Social History   Socioeconomic History   Marital status: Single    Spouse name: Not on file   Number of children: Not on file   Years of education: JR at [employer] education level: Some college, no degree  Occupational History   Not on file  Tobacco Use   Smoking status: Never    Passive exposure: Current   Smokeless tobacco: Never  Vaping Use   Vaping status: Never Used  Substance and Sexual Activity   Alcohol use: Yes    Comment: occ   Drug use: No   Sexual activity: Yes    Birth control/protection: None  Other Topics Concern   Not on file  Social  History Narrative   Not on file   Social Drivers of Health   Financial Resource Strain: Not on file  Food Insecurity: No Food Insecurity (11/02/2019)   Received from [facility], [facility]   Hunger Vital Sign    Worried About Running Out of Food in the Last Year: Never true    Ran Out of Food in the Last Year: Never true  Transportation Needs: No Transportation Needs (11/02/2019)   Received from [facility], [facility]   [facility] - Transportation    Lack of Transportation (Medical): No    Lack of Transportation (Non-Medical): No  Physical Activity: Not on file  Stress: No Stress Concern Present (11/02/2019)   Received from [facility], [facility] of Occupational Health - Occupational Stress Questionnaire    Feeling of Stress : Not at all  Social Connections: Not on file  Intimate Partner Violence: Not At Risk (11/02/2019)   Received from [facility], [facility]   Humiliation, Afraid, Rape, and Kick questionnaire    Fear of Current or Ex-Partner: No    Emotionally Abused: No    Physically Abused: No  Sexually Abused: No    Outpatient Medications Prior to Visit  Medication Sig Dispense Refill   levocetirizine (XYZAL) 5 MG tablet Take 1 tablet (5 mg total) by mouth in the morning and at bedtime. (Patient not taking: Reported on 11/10/2023) 60 tablet 5   pantoprazole (PROTONIX) 20 MG tablet Take 1 tablet (20 mg total) by mouth daily. (Patient not taking: Reported on 11/10/2023) 30 tablet 3   RYALTRIS 665-25 MCG/ACT SUSP Place 2 sprays into the nose 2 times daily at 12 noon and 4 pm. (Patient not taking: Reported on 11/10/2023)     No facility-administered medications prior to visit.    No Known Allergies  ROS Review of Systems  Constitutional:  Negative for chills and fever.  Eyes:  Negative for visual disturbance.  Respiratory:  Negative for chest tightness and shortness of breath.   Neurological:  Negative for  dizziness and headaches.      Objective:    Physical Exam HENT:     Head: Normocephalic.     Mouth/Throat:     Mouth: Mucous membranes are moist.  Cardiovascular:     Rate and Rhythm: Normal rate.     Heart sounds: Normal heart sounds.  Pulmonary:     Effort: Pulmonary effort is normal.     Breath sounds: Normal breath sounds.  Neurological:     Mental Status: She is alert.     BP 116/69   Pulse 84   Ht 5' 2.5" (1.588 m)   Wt 148 lb 1.3 oz (67.2 kg)   SpO2 95%   BMI 26.65 kg/m  Wt Readings from Last 3 Encounters:  11/10/23 148 lb 1.3 oz (67.2 kg)  06/29/23 150 lb 0.6 oz (68.1 kg)  05/07/23 145 lb 12.8 oz (66.1 kg)    Lab Results  Component Value Date   TSH 1.110 11/10/2023   Lab Results  Component Value Date   WBC 4.1 11/10/2023   HGB 13.5 11/10/2023   HCT 39.1 11/10/2023   MCV 95 11/10/2023   PLT 263 11/10/2023   Lab Results  Component Value Date   NA 137 11/10/2023   K 4.0 11/10/2023   CO2 20 11/10/2023   GLUCOSE 84 11/10/2023   BUN 9 11/10/2023   CREATININE 0.67 11/10/2023   BILITOT 0.8 11/10/2023   ALKPHOS 70 11/10/2023   AST 16 11/10/2023   ALT 11 11/10/2023   PROT 7.8 11/10/2023   ALBUMIN 4.8 11/10/2023   CALCIUM 9.5 11/10/2023   ANIONGAP 8 10/29/2020   EGFR 126 11/10/2023   Lab Results  Component Value Date   CHOL 164 11/10/2023   Lab Results  Component Value Date   HDL 60 11/10/2023   Lab Results  Component Value Date   LDLCALC 94 11/10/2023   Lab Results  Component Value Date   TRIG 45 11/10/2023   Lab Results  Component Value Date   CHOLHDL 2.7 11/10/2023   Lab Results  Component Value Date   HGBA1C 5.1 11/10/2023      Assessment & Plan:  Panic attacks Assessment & Plan: The patient reports experiencing approximately three episodes of panic attacks. She notes these occur in large gatherings and are accompanied by dizziness, palpitations, nausea, and an urge to defecate. She mentions a long history of social  anxiety, with symptoms becoming more noticeable recently, starting with the first occurrence in November 2024. The patient also shares that she will begin graduate school at [employer] in the fall and has been feeling some anxiety  related to this transition. She is currently speaking with a psychiatrist and has an appointment scheduled for next month.  The patient was encouraged to continue cognitive behavioral therapy (CBT) and was provided with nonpharmacological strategies to manage symptoms, including deep breathing exercises, mindfulness techniques, and reality testing--reminding herself that the symptoms are not life-threatening, are temporary, and will eventually pass. The patient verbalized understanding, is aware of the plan of care, and will continue follow-up with her therapist.  Will initiate therapy on hydroxyzine 25 mg every 8 hours as needed  Orders: -     hydrOXYzine Pamoate; Take 1 capsule (25 mg total) by mouth every 8 (eight) hours as needed.  Dispense: 30 capsule; Refill: 1  Note: This chart has been completed using Engineer, civil (consulting) software, and while attempts have been made to ensure accuracy, certain words and phrases may not be transcribed as intended.    Follow-up: Return in about 2 weeks (around 11/24/2023) for knee concerns.   Gilmore Laroche, FNP

## 2023-11-11 DIAGNOSIS — F410 Panic disorder [episodic paroxysmal anxiety] without agoraphobia: Secondary | ICD-10-CM | POA: Insufficient documentation

## 2023-11-11 LAB — LIPID PANEL
Chol/HDL Ratio: 2.7 {ratio} (ref 0.0–4.4)
Cholesterol, Total: 164 mg/dL (ref 100–199)
HDL: 60 mg/dL (ref >39)
LDL Chol Calc (NIH): 94 mg/dL (ref 0–99)
Triglycerides: 45 mg/dL (ref 0–149)
VLDL Cholesterol Cal: 10 mg/dL (ref 5–40)

## 2023-11-11 LAB — CMP14+EGFR
ALT: 11 [IU]/L (ref 0–32)
AST: 16 [IU]/L (ref 0–40)
Albumin: 4.8 g/dL (ref 4.0–5.0)
Alkaline Phosphatase: 70 [IU]/L (ref 44–121)
BUN/Creatinine Ratio: 13 (ref 9–23)
BUN: 9 mg/dL (ref 6–20)
Bilirubin Total: 0.8 mg/dL (ref 0.0–1.2)
CO2: 20 mmol/L (ref 20–29)
Calcium: 9.5 mg/dL (ref 8.7–10.2)
Chloride: 101 mmol/L (ref 96–106)
Creatinine, Ser: 0.67 mg/dL (ref 0.57–1.00)
Globulin, Total: 3 g/dL (ref 1.5–4.5)
Glucose: 84 mg/dL (ref 70–99)
Potassium: 4 mmol/L (ref 3.5–5.2)
Sodium: 137 mmol/L (ref 134–144)
Total Protein: 7.8 g/dL (ref 6.0–8.5)
eGFR: 126 mL/min/{1.73_m2} (ref >59)

## 2023-11-11 LAB — CBC WITH DIFFERENTIAL/PLATELET
Basophils Absolute: 0 10*3/uL (ref 0.0–0.2)
Basos: 1 %
EOS (ABSOLUTE): 0.1 10*3/uL (ref 0.0–0.4)
Eos: 2 %
Hematocrit: 39.1 % (ref 34.0–46.6)
Hemoglobin: 13.5 g/dL (ref 11.1–15.9)
Immature Grans (Abs): 0 10*3/uL (ref 0.0–0.1)
Immature Granulocytes: 0 %
Lymphocytes Absolute: 1.9 10*3/uL (ref 0.7–3.1)
Lymphs: 46 %
MCH: 32.7 pg (ref 26.6–33.0)
MCHC: 34.5 g/dL (ref 31.5–35.7)
MCV: 95 fL (ref 79–97)
Monocytes Absolute: 0.3 10*3/uL (ref 0.1–0.9)
Monocytes: 8 %
Neutrophils Absolute: 1.8 10*3/uL (ref 1.4–7.0)
Neutrophils: 43 %
Platelets: 263 10*3/uL (ref 150–450)
RBC: 4.13 x10E6/uL (ref 3.77–5.28)
RDW: 11.7 % (ref 11.7–15.4)
WBC: 4.1 10*3/uL (ref 3.4–10.8)

## 2023-11-11 LAB — VITAMIN D 25 HYDROXY (VIT D DEFICIENCY, FRACTURES): Vit D, 25-Hydroxy: 31.3 ng/mL (ref 30.0–100.0)

## 2023-11-11 LAB — HEMOGLOBIN A1C
Est. average glucose Bld gHb Est-mCnc: 100 mg/dL
Hgb A1c MFr Bld: 5.1 % (ref 4.8–5.6)

## 2023-11-11 LAB — TSH+FREE T4
Free T4: 1.32 ng/dL (ref 0.82–1.77)
TSH: 1.11 u[IU]/mL (ref 0.450–4.500)

## 2023-11-11 NOTE — Progress Notes (Signed)
Please inform the patient that her labs are stable

## 2023-11-12 NOTE — Assessment & Plan Note (Signed)
The patient reports experiencing approximately three episodes of panic attacks. She notes these occur in large gatherings and are accompanied by dizziness, palpitations, nausea, and an urge to defecate. She mentions a long history of social anxiety, with symptoms becoming more noticeable recently, starting with the first occurrence in November 2024. The patient also shares that she will begin graduate school at [employer] in the fall and has been feeling some anxiety related to this transition. She is currently speaking with a psychiatrist and has an appointment scheduled for next month.  The patient was encouraged to continue cognitive behavioral therapy (CBT) and was provided with nonpharmacological strategies to manage symptoms, including deep breathing exercises, mindfulness techniques, and reality testing--reminding herself that the symptoms are not life-threatening, are temporary, and will eventually pass. The patient verbalized understanding, is aware of the plan of care, and will continue follow-up with her therapist.  Will initiate therapy on hydroxyzine 25 mg every 8 hours as needed

## 2023-11-29 ENCOUNTER — Ambulatory Visit: Payer: Managed Care, Other (non HMO) | Admitting: Family Medicine

## 2023-11-30 ENCOUNTER — Encounter: Payer: Self-pay | Admitting: Family Medicine

## 2023-11-30 ENCOUNTER — Ambulatory Visit: Payer: Managed Care, Other (non HMO) | Admitting: Family Medicine

## 2023-11-30 VITALS — BP 102/68 | HR 103 | Ht 62.5 in | Wt 149.1 lb

## 2023-11-30 DIAGNOSIS — S83005D Unspecified dislocation of left patella, subsequent encounter: Secondary | ICD-10-CM

## 2023-11-30 DIAGNOSIS — K219 Gastro-esophageal reflux disease without esophagitis: Secondary | ICD-10-CM

## 2023-11-30 MED ORDER — PANTOPRAZOLE SODIUM 20 MG PO TBEC
20.0000 mg | DELAYED_RELEASE_TABLET | Freq: Every day | ORAL | 3 refills | Status: DC
Start: 2023-11-30 — End: 2024-03-20

## 2023-11-30 MED ORDER — IBUPROFEN 800 MG PO TABS
800.0000 mg | ORAL_TABLET | Freq: Three times a day (TID) | ORAL | 0 refills | Status: DC | PRN
Start: 2023-11-30 — End: 2024-07-20

## 2023-11-30 NOTE — Patient Instructions (Addendum)
 I appreciate the opportunity to provide care to you today!    Follow up:  3 months  Please stop by [facility] to get an X-ray of the left knee.  I recommend the following:  Rest: Avoid activities that put strain on the knee. Ice: Apply ice to reduce swelling and pain. Compression: Use a knee brace to support the joint. Elevation: Elevate the knee to reduce swelling. Medications: Take over-the-counter pain relievers like ibuprofen  or acetaminophen  for pain management. Physical Therapy: Strengthening and mobility exercises to improve joint function and reduce pain.   Attached with your AVS, you will find valuable resources for self-education. I highly recommend dedicating some time to thoroughly examine them.   Please continue to a heart-healthy diet and increase your physical activities. Try to exercise for at least five days a week.    It was a pleasure to see you and I look forward to continuing to work together on your health and well-being. Please do not hesitate to call the office if you need care or have questions about your care.  In case of emergency, please visit the Emergency Department for urgent care, or contact our clinic at [phone] to schedule an appointment. We're here to help you!   Have a wonderful day and week. With Gratitude, Tran Arzuaga MSN, FNP-BC

## 2023-11-30 NOTE — Progress Notes (Signed)
 Established Patient Office Visit  Subjective:  Patient ID: Janet Lynch, female    DOB: 15-Apr-2001  Age: 23 y.o. MRN: [id number]  CC:  Chief Complaint  Patient presents with   Care Management    5 month f/u   Knee Pain    Pt reports knee pain is still present , ongoing since last visit.     HPI Janet Lynch is a 23 y.o. female with past medical history of GERD, lumbar pain, anxiety and presents for  left knee f/u. For the details of today's visit, please refer to the assessment and plan.    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   right    Past Surgical History:  Procedure Laterality Date   KNEE SURGERY Left    TYMPANOPLASTY  11/21/2012   Procedure: TYMPANOPLASTY;  Surgeon: Ana LELON Moccasin, MD;  Location: [facility];  Service: ENT;  Laterality: Right;  with fascia graft    TYMPANOSTOMY TUBE PLACEMENT  age 75    Family History  Problem Relation Age of Onset   Cancer Other    Heart disease Other    Asthma Cousin    Eczema Cousin     Social History   Socioeconomic History   Marital status: Single    Spouse name: Not on file   Number of children: Not on file   Years of education: JR at [employer] education level: Some college, no degree  Occupational History   Not on file  Tobacco Use   Smoking status: Never    Passive exposure: Current   Smokeless tobacco: Never  Vaping Use   Vaping status: Never Used  Substance and Sexual Activity   Alcohol use: Yes    Comment: occ   Drug use: No   Sexual activity: Yes    Birth control/protection: None  Other Topics Concern   Not on file  Social History Narrative   Not on file   Social Drivers of Health   Financial Resource Strain: Not on file  Food Insecurity: No Food Insecurity (11/02/2019)   Received from [facility], [facility]   Hunger Vital Sign    Worried About Running Out of Food in  the Last Year: Never true    Ran Out of Food in the Last Year: Never true  Transportation Needs: No Transportation Needs (11/02/2019)   Received from [facility], [facility]   [facility] - Transportation    Lack of Transportation (Medical): No    Lack of Transportation (Non-Medical): No  Physical Activity: Not on file  Stress: No Stress Concern Present (11/02/2019)   Received from [facility], [facility] of Occupational Health - Occupational Stress Questionnaire    Feeling of Stress : Not at all  Social Connections: Not on file  Intimate Partner Violence: Not At Risk (11/02/2019)   Received from [facility], [facility]   Humiliation, Afraid, Rape, and Kick questionnaire    Fear of Current or Ex-Partner: No    Emotionally Abused: No    Physically Abused: No    Sexually Abused: No    Outpatient Medications Prior to Visit  Medication Sig Dispense Refill   hydrOXYzine  (VISTARIL ) 25 MG capsule Take 1 capsule (25 mg total) by mouth every 8 (eight) hours as needed. 30 capsule  1   levocetirizine (XYZAL ) 5 MG tablet Take 1 tablet (5 mg total) by mouth in the morning and at bedtime. 60 tablet 5   RYALTRIS  665-25 MCG/ACT SUSP Place 2 sprays into the nose 2 times daily at 12 noon and 4 pm.     pantoprazole  (PROTONIX ) 20 MG tablet Take 1 tablet (20 mg total) by mouth daily. 30 tablet 3   No facility-administered medications prior to visit.    No Known Allergies  ROS Review of Systems  Constitutional:  Negative for chills and fever.  Eyes:  Negative for visual disturbance.  Respiratory:  Negative for chest tightness and shortness of breath.   Musculoskeletal:        Left knee pain  Neurological:  Negative for dizziness and headaches.      Objective:    Physical Exam HENT:     Head: Normocephalic.     Mouth/Throat:     Mouth: Mucous membranes are moist.  Cardiovascular:     Rate and Rhythm: Normal rate.     Heart sounds: Normal heart  sounds.  Pulmonary:     Effort: Pulmonary effort is normal.     Breath sounds: Normal breath sounds.  Musculoskeletal:     Right knee: No swelling, deformity, effusion, erythema or ecchymosis. Normal range of motion.     Left knee: No swelling, deformity, effusion, erythema or ecchymosis. Normal range of motion.  Neurological:     Mental Status: She is alert.     BP 102/68   Pulse (!) 103   Ht 5' 2.5 (1.588 m)   Wt 149 lb 1.9 oz (67.6 kg)   SpO2 99%   BMI 26.84 kg/m  Wt Readings from Last 3 Encounters:  11/30/23 149 lb 1.9 oz (67.6 kg)  11/10/23 148 lb 1.3 oz (67.2 kg)  06/29/23 150 lb 0.6 oz (68.1 kg)    Lab Results  Component Value Date   TSH 1.110 11/10/2023   Lab Results  Component Value Date   WBC 4.1 11/10/2023   HGB 13.5 11/10/2023   HCT 39.1 11/10/2023   MCV 95 11/10/2023   PLT 263 11/10/2023   Lab Results  Component Value Date   NA 137 11/10/2023   K 4.0 11/10/2023   CO2 20 11/10/2023   GLUCOSE 84 11/10/2023   BUN 9 11/10/2023   CREATININE 0.67 11/10/2023   BILITOT 0.8 11/10/2023   ALKPHOS 70 11/10/2023   AST 16 11/10/2023   ALT 11 11/10/2023   PROT 7.8 11/10/2023   ALBUMIN 4.8 11/10/2023   CALCIUM 9.5 11/10/2023   ANIONGAP 8 10/29/2020   EGFR 126 11/10/2023   Lab Results  Component Value Date   CHOL 164 11/10/2023   Lab Results  Component Value Date   HDL 60 11/10/2023   Lab Results  Component Value Date   LDLCALC 94 11/10/2023   Lab Results  Component Value Date   TRIG 45 11/10/2023   Lab Results  Component Value Date   CHOLHDL 2.7 11/10/2023   Lab Results  Component Value Date   HGBA1C 5.1 11/10/2023      Assessment & Plan:  Dislocation of left patella, subsequent encounter Assessment & Plan: The patient reports a history of patellar dislocation of the left knee in 2013. Recently, she has been experiencing increased knee pain with activity. She denies swelling, warmth, or redness with no limitation in ROM.On  examination, there is mild pain with tenderness at the lateral aspect of the knee, with no recent injury  or trauma reported. Range of motion is intact, and no deformity is observed.The patient has not been taking any medication at home for pain relief.She is encouraged to start taking ibuprofen  800 mg every eight hours as needed for pain.Ice therapy is effective, and alternating with heat therapy is suggested. She is encouraged to avoid aggravating activities, such as running and jumping.She is advised to use a knee brace for stability.The patient is encouraged to follow up if her symptoms do not improve or worsen. A referral to orthopedic surgery will be considered if symptoms persist or deteriorate.   Orders: -     DG Knee 1-2 Views Left -     Ibuprofen ; Take 1 tablet (800 mg total) by mouth every 8 (eight) hours as needed.  Dispense: 30 tablet; Refill: 0  Gastroesophageal reflux disease without esophagitis Assessment & Plan: Refilled Protonix   Orders: -     Pantoprazole  Sodium; Take 1 tablet (20 mg total) by mouth daily.  Dispense: 30 tablet; Refill: 3   Note: This chart has been completed using Engineer, [employer] (consulting) software, and while attempts have been made to ensure accuracy, certain words and phrases may not be transcribed as intended.   Follow-up: Return in about 3 months (around 02/28/2024).   Karen Huhta, FNP

## 2023-12-02 NOTE — Assessment & Plan Note (Signed)
 The patient reports a history of patellar dislocation of the left knee in 2013. Recently, she has been experiencing increased knee pain with activity. She denies swelling, warmth, or redness with no limitation in ROM.On examination, there is mild pain with tenderness at the lateral aspect of the knee, with no recent injury or trauma reported. Range of motion is intact, and no deformity is observed.The patient has not been taking any medication at home for pain relief.She is encouraged to start taking ibuprofen  800 mg every eight hours as needed for pain.Ice therapy is effective, and alternating with heat therapy is suggested. She is encouraged to avoid aggravating activities, such as running and jumping.She is advised to use a knee brace for stability.The patient is encouraged to follow up if her symptoms do not improve or worsen. A referral to orthopedic surgery will be considered if symptoms persist or deteriorate.

## 2023-12-02 NOTE — Assessment & Plan Note (Signed)
 Refilled Protonix

## 2024-02-29 ENCOUNTER — Ambulatory Visit: Payer: Managed Care, Other (non HMO) | Admitting: Family Medicine

## 2024-03-20 ENCOUNTER — Ambulatory Visit: Payer: Managed Care, Other (non HMO) | Admitting: Family Medicine

## 2024-03-20 ENCOUNTER — Encounter: Payer: Self-pay | Admitting: Family Medicine

## 2024-03-20 VITALS — BP 109/73 | HR 75 | Resp 16 | Ht 65.0 in | Wt 141.1 lb

## 2024-03-20 DIAGNOSIS — F410 Panic disorder [episodic paroxysmal anxiety] without agoraphobia: Secondary | ICD-10-CM | POA: Diagnosis not present

## 2024-03-20 DIAGNOSIS — E038 Other specified hypothyroidism: Secondary | ICD-10-CM

## 2024-03-20 DIAGNOSIS — R7301 Impaired fasting glucose: Secondary | ICD-10-CM

## 2024-03-20 DIAGNOSIS — F419 Anxiety disorder, unspecified: Secondary | ICD-10-CM

## 2024-03-20 DIAGNOSIS — E559 Vitamin D deficiency, unspecified: Secondary | ICD-10-CM

## 2024-03-20 DIAGNOSIS — F32A Depression, unspecified: Secondary | ICD-10-CM

## 2024-03-20 DIAGNOSIS — E7849 Other hyperlipidemia: Secondary | ICD-10-CM

## 2024-03-20 DIAGNOSIS — K219 Gastro-esophageal reflux disease without esophagitis: Secondary | ICD-10-CM | POA: Diagnosis not present

## 2024-03-20 DIAGNOSIS — Z114 Encounter for screening for human immunodeficiency virus [HIV]: Secondary | ICD-10-CM

## 2024-03-20 DIAGNOSIS — A749 Chlamydial infection, unspecified: Secondary | ICD-10-CM

## 2024-03-20 MED ORDER — PANTOPRAZOLE SODIUM 20 MG PO TBEC: 20.0000 mg | DELAYED_RELEASE_TABLET | Freq: Every day | ORAL | 3 refills | Status: DC

## 2024-03-20 MED ORDER — HYDROXYZINE PAMOATE 25 MG PO CAPS: 25.0000 mg | ORAL_CAPSULE | Freq: Three times a day (TID) | ORAL | 2 refills | Status: AC | PRN

## 2024-03-20 NOTE — Patient Instructions (Addendum)
 I appreciate the opportunity to provide care to you today!    Follow up:  4 months  Labs: please stop by the lab today/ during the week to get your blood drawn (CBC, CMP, TSH, Lipid profile, HgA1c, Vit D)  Get help right away if: If having thoughts about taking  your own life. You can go to your nearest emergency room or: Call a crisis center or a local suicide prevention center. These are often located at hospitals, clinics, [employer], social service providers, or health departments. Call 911. Call the Suicide & Crisis Lifeline (free and confidential): Call 818-670-6319 or 988. Text 951-131-9504. If your loved one is a Veteran: Call 988 and press 1. Text the [employer] at (718)189-0617. Call the [employer] health and human services helpline (211 in the [place]  Attached with your AVS, you will find valuable resources for self-education. I highly recommend dedicating some time to thoroughly examine them.   Please continue to a heart-healthy diet and increase your physical activities. Try to exercise for at least five days a week.    It was a pleasure to see you and I look forward to continuing to work together on your health and well-being. Please do not hesitate to call the office if you need care or have questions about your care.  In case of emergency, please visit the Emergency Department for urgent care, or contact our clinic at [phone] to schedule an appointment. We're here to help you!   Have a wonderful day and week. With Gratitude, Tiyanna Larcom MSN, FNP-BC

## 2024-03-20 NOTE — Progress Notes (Unsigned)
 Established Patient Office Visit  Subjective:  Patient ID: Janet Lynch, female    DOB: 06/02/2001  Age: 23 y.o. MRN: [id number]  CC:  Chief Complaint  Patient presents with   Follow-up    HPI Janet Lynch is a 23 y.o. female with past medical history of Anxiety and depression, GERD presents for f/u of  chronic medical conditions. For the details of today's visit, please refer to the assessment and plan.     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   right    Past Surgical History:  Procedure Laterality Date   KNEE SURGERY Left    TYMPANOPLASTY  11/21/2012   Procedure: TYMPANOPLASTY;  Surgeon: Lawence Press, MD;  Location: [facility];  Service: ENT;  Laterality: Right;  with fascia graft    TYMPANOSTOMY TUBE PLACEMENT  age 64    Family History  Problem Relation Age of Onset   Cancer Other    Heart disease Other    Asthma Cousin    Eczema Cousin     Social History   Socioeconomic History   Marital status: Single    Spouse name: Not on file   Number of children: Not on file   Years of education: JR at [employer] education level: Some college, no degree  Occupational History   Not on file  Tobacco Use   Smoking status: Never    Passive exposure: Current   Smokeless tobacco: Never  Vaping Use   Vaping status: Never Used  Substance and Sexual Activity   Alcohol use: Yes    Comment: occ   Drug use: No   Sexual activity: Yes    Birth control/protection: None  Other Topics Concern   Not on file  Social History Narrative   Not on file   Social Drivers of Health   Financial Resource Strain: Not on file  Food Insecurity: No Food Insecurity (11/02/2019)   Received from [facility], [facility]   Hunger Vital Sign    Worried About Running Out of Food in the Last Year: Never true    Ran Out of Food in the Last Year: Never true   Transportation Needs: No Transportation Needs (11/02/2019)   Received from [facility], [facility]   [facility] - Transportation    Lack of Transportation (Medical): No    Lack of Transportation (Non-Medical): No  Physical Activity: Not on file  Stress: No Stress Concern Present (11/02/2019)   Received from [facility], [facility] Occupational Health - Occupational Stress Questionnaire    Feeling of Stress : Not at all  Social Connections: Not on file  Intimate Partner Violence: Not At Risk (11/02/2019)   Received from [facility], [facility]   Humiliation, Afraid, Rape, and Kick questionnaire    Fear of Current or Ex-Partner: No    Emotionally Abused: No    Physically Abused: No    Sexually Abused: No    Outpatient Medications Prior to Visit  Medication Sig Dispense Refill   levocetirizine (XYZAL ) 5 MG tablet Take 1 tablet (5 mg total) by mouth in the morning and at bedtime. 60 tablet 5   hydrOXYzine  (VISTARIL ) 25 MG capsule Take 1 capsule (25 mg total) by mouth every 8 (eight) hours as needed. 30 capsule 1  pantoprazole  (PROTONIX ) 20 MG tablet Take 1 tablet (20 mg total) by mouth daily. 30 tablet 3   ibuprofen  (ADVIL ) 800 MG tablet Take 1 tablet (800 mg total) by mouth every 8 (eight) hours as needed. (Patient not taking: Reported on 03/20/2024) 30 tablet 0   RYALTRIS  665-25 MCG/ACT SUSP Place 2 sprays into the nose 2 times daily at 12 noon and 4 pm. (Patient not taking: Reported on 03/20/2024)     No facility-administered medications prior to visit.    No Known Allergies  ROS Review of Systems  Constitutional:  Negative for chills and fever.  Eyes:  Negative for visual disturbance.  Respiratory:  Negative for chest tightness and shortness of breath.   Neurological:  Negative for dizziness and headaches.      Objective:    Physical Exam HENT:     Head: Normocephalic.     Mouth/Throat:     Mouth: Mucous membranes are  moist.  Cardiovascular:     Rate and Rhythm: Normal rate.     Heart sounds: Normal heart sounds.  Pulmonary:     Effort: Pulmonary effort is normal.     Breath sounds: Normal breath sounds.  Neurological:     Mental Status: She is alert.  Psychiatric:        Mood and Affect: Mood is depressed. Affect is flat.     BP 109/73   Pulse 75   Resp 16   Ht 5\' 5"  (1.651 m)   Wt 141 lb 1.9 oz (64 kg)   SpO2 97%   BMI 23.48 kg/m  Wt Readings from Last 3 Encounters:  03/20/24 141 lb 1.9 oz (64 kg)  11/30/23 149 lb 1.9 oz (67.6 kg)  11/10/23 148 lb 1.3 oz (67.2 kg)    Lab Results  Component Value Date   TSH 1.110 11/10/2023   Lab Results  Component Value Date   WBC 4.1 11/10/2023   HGB 13.5 11/10/2023   HCT 39.1 11/10/2023   MCV 95 11/10/2023   PLT 263 11/10/2023   Lab Results  Component Value Date   NA 137 11/10/2023   K 4.0 11/10/2023   CO2 20 11/10/2023   GLUCOSE 84 11/10/2023   BUN 9 11/10/2023   CREATININE 0.67 11/10/2023   BILITOT 0.8 11/10/2023   ALKPHOS 70 11/10/2023   AST 16 11/10/2023   ALT 11 11/10/2023   PROT 7.8 11/10/2023   ALBUMIN 4.8 11/10/2023   CALCIUM 9.5 11/10/2023   ANIONGAP 8 10/29/2020   EGFR 126 11/10/2023   Lab Results  Component Value Date   CHOL 164 11/10/2023   Lab Results  Component Value Date   HDL 60 11/10/2023   Lab Results  Component Value Date   LDLCALC 94 11/10/2023   Lab Results  Component Value Date   TRIG 45 11/10/2023   Lab Results  Component Value Date   CHOLHDL 2.7 11/10/2023   Lab Results  Component Value Date   HGBA1C 5.1 11/10/2023      Assessment & Plan:  Anxiety and depression Assessment & Plan: The patient's PHQ-9 and GAD-7 scores indicate moderate anxiety and depression. She reports juggling many responsibilities at the moment and is grieving a previous relationship that ended in December 2024 while currently navigating a new relationship. She also reports that her job has been demanding, all of  which is contributing to her feelings of depression. She denies any active plan to harm herself or others. We discussed stress management strategies, resuming therapy with her  counselor, and utilizing the crisis line if thoughts of self-harm resurface. The patient verbalized understanding.  Flowsheet Row Office Visit from 03/20/2024 in [facility]  PHQ-9 Total Score 18          11/10/2023    8:26 AM 06/29/2023    2:58 PM 06/29/2023    2:51 PM 03/02/2023    1:11 PM  GAD 7 : Generalized Anxiety Score  Nervous, Anxious, on Edge 2 2 0 0  Control/stop worrying 1 1 0 1  Worry too much - different things 3 3 0 1  Trouble relaxing 1 1 0 0  Restless 0 1 0 0  Easily annoyed or irritable 1 3 0 0  Afraid - awful might happen 1 3 0 2  Total GAD 7 Score 9 14 0 4  Anxiety Difficulty Somewhat difficult Not difficult at all Not difficult at all Not difficult at all       Gastroesophageal reflux disease without esophagitis Assessment & Plan: Stable on Protonix   Orders: -     Pantoprazole  Sodium; Take 1 tablet (20 mg total) by mouth daily.  Dispense: 30 tablet; Refill: 3  Panic attacks -     hydrOXYzine  Pamoate; Take 1 capsule (25 mg total) by mouth every 8 (eight) hours as needed.  Dispense: 30 capsule; Refill: 2  IFG (impaired fasting glucose) -     Hemoglobin A1c  Vitamin D  deficiency -     VITAMIN D  25 Hydroxy (Vit-D Deficiency, Fractures)  TSH (thyroid-stimulating hormone deficiency) -     TSH + free T4  Other hyperlipidemia -     Lipid panel -     CMP14+EGFR -     CBC with Differential/Platelet  Chlamydia -     Chlamydia/GC NAA, Confirmation  Encounter for screening for HIV -     HIV Antibody (routine testing w rflx)  Note: This chart has been completed using [employer] software, and while attempts have been made to ensure accuracy, certain words and phrases may not be transcribed as intended.    Follow-up: Return in about 4 months (around  07/20/2024).   Kaimana Neuzil, FNP

## 2024-03-22 NOTE — Assessment & Plan Note (Signed)
 The patient's PHQ-9 and GAD-7 scores indicate moderate anxiety and depression. She reports juggling many responsibilities at the moment and is grieving a previous relationship that ended in December 2024 while currently navigating a new relationship. She also reports that her job has been demanding, all of which is contributing to her feelings of depression. She denies any active plan to harm herself or others. We discussed stress management strategies, resuming therapy with her counselor, and utilizing the crisis line if thoughts of self-harm resurface. The patient verbalized understanding.  Flowsheet Row Office Visit from 03/20/2024 in [facility]  PHQ-9 Total Score 18          11/10/2023    8:26 AM 06/29/2023    2:58 PM 06/29/2023    2:51 PM 03/02/2023    1:11 PM  GAD 7 : Generalized Anxiety Score  Nervous, Anxious, on Edge 2 2 0 0  Control/stop worrying 1 1 0 1  Worry too much - different things 3 3 0 1  Trouble relaxing 1 1 0 0  Restless 0 1 0 0  Easily annoyed or irritable 1 3 0 0  Afraid - awful might happen 1 3 0 2  Total GAD 7 Score 9 14 0 4  Anxiety Difficulty Somewhat difficult Not difficult at all Not difficult at all Not difficult at all

## 2024-03-22 NOTE — Assessment & Plan Note (Signed)
 Stable on Protonix.

## 2024-05-23 ENCOUNTER — Encounter: Payer: Self-pay | Admitting: Family Medicine

## 2024-05-26 ENCOUNTER — Other Ambulatory Visit: Payer: Self-pay | Admitting: Family Medicine

## 2024-05-26 DIAGNOSIS — A64 Unspecified sexually transmitted disease: Secondary | ICD-10-CM

## 2024-07-20 ENCOUNTER — Ambulatory Visit: Admitting: Family Medicine

## 2024-07-20 ENCOUNTER — Encounter: Payer: Self-pay | Admitting: Family Medicine

## 2024-07-20 VITALS — BP 91/65 | HR 82 | Ht 65.0 in | Wt 137.0 lb

## 2024-07-20 DIAGNOSIS — E559 Vitamin D deficiency, unspecified: Secondary | ICD-10-CM

## 2024-07-20 DIAGNOSIS — R7301 Impaired fasting glucose: Secondary | ICD-10-CM | POA: Diagnosis not present

## 2024-07-20 DIAGNOSIS — K219 Gastro-esophageal reflux disease without esophagitis: Secondary | ICD-10-CM

## 2024-07-20 DIAGNOSIS — M542 Cervicalgia: Secondary | ICD-10-CM | POA: Diagnosis not present

## 2024-07-20 DIAGNOSIS — A64 Unspecified sexually transmitted disease: Secondary | ICD-10-CM

## 2024-07-20 DIAGNOSIS — E038 Other specified hypothyroidism: Secondary | ICD-10-CM

## 2024-07-20 DIAGNOSIS — T148XXA Other injury of unspecified body region, initial encounter: Secondary | ICD-10-CM | POA: Insufficient documentation

## 2024-07-20 DIAGNOSIS — E7849 Other hyperlipidemia: Secondary | ICD-10-CM

## 2024-07-20 MED ORDER — PANTOPRAZOLE SODIUM 20 MG PO TBEC: 20.0000 mg | DELAYED_RELEASE_TABLET | Freq: Every day | ORAL | 3 refills | Status: AC

## 2024-07-20 MED ORDER — CYCLOBENZAPRINE HCL 5 MG PO TABS: 5.0000 mg | ORAL_TABLET | Freq: Three times a day (TID) | ORAL | 1 refills | Status: AC | PRN

## 2024-07-20 NOTE — Assessment & Plan Note (Addendum)
 Symptoms likely related to gastric irritation Will refill Protonix  GERD diet encouraged

## 2024-07-20 NOTE — Progress Notes (Signed)
 Established Patient Office Visit  Subjective:  Patient ID: Janet Lynch, female    DOB: [birth year]  Age: 23 y.o. MRN: [id number]  CC:  Chief Complaint  Patient presents with   Medical Management of Chronic Issues    HPI Janet Lynch is a 23 y.o. female with past medical history of GERD, anxiety and depression presents for f/u of  chronic medical conditions.  The patient reports experiencing an episode of abdominal pain radiating to the lower back that occurred after eating. She describes the pain as throbbing and associated with a sensation of her stomach feeling "very hot." The episode lasted approximately 30 minutes and has not recurred since.  The pain began shortly after eating a mini burger and an omelette. She denies similar prior episodes. She notes that she has not been taking her Protonix  due to the cost of the medication when she attempted to fill the prescription at another pharmacy.        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   right    Past Surgical History:  Procedure Laterality Date   KNEE SURGERY Left    TYMPANOPLASTY  11/21/2012   Procedure: TYMPANOPLASTY;  Surgeon: Ana LELON Moccasin, MD;  Location: [facility];  Service: ENT;  Laterality: Right;  with fascia graft    TYMPANOSTOMY TUBE PLACEMENT  age 26    Family History  Problem Relation Age of Onset   Cancer Other    Heart disease Other    Asthma Cousin    Eczema Cousin     Social History   Socioeconomic History   Marital status: Single    Spouse name: Not on file   Number of children: Not on file   Years of education: JR at [employer] education level: Some college, no degree  Occupational History   Not on file  Tobacco Use   Smoking status: Never    Passive exposure: Current   Smokeless tobacco: Never  Vaping Use   Vaping status: Never Used  Substance and Sexual Activity    Alcohol use: Yes    Comment: occ   Drug use: No   Sexual activity: Yes    Birth control/protection: None  Other Topics Concern   Not on file  Social History Narrative   Not on file   Social Drivers of Health   Financial Resource Strain: Not on file  Food Insecurity: No Food Insecurity (11/02/2019)   Received from [facility]   Hunger Vital Sign    Within the past 12 months, you worried that your food would run out before you got the money to buy more.: Never true    Within the past 12 months, the food you bought just didn't last and you didn't have money to get more.: Never true  Transportation Needs: No Transportation Needs (11/02/2019)   Received from [facility] - Transportation    Lack of Transportation (Medical): No    Lack of Transportation (Non-Medical): No  Physical Activity: Not on file  Stress: No Stress Concern Present (11/02/2019)   Received from [facility]ational Health - Occupational Stress Questionnaire    Feeling of Stress : Not at all  Social Connections: Not on file  Intimate Partner Violence: Not At Risk (11/02/2019)   [facility] from [facility]   Humiliation, Afraid, Rape, and Kick questionnaire    Within the last year, have you been afraid of your partner or ex-partner?: No    Within the last year, have you been humiliated or emotionally abused in other ways by your partner or ex-partner?: No    Within the last year, have you been kicked, hit, slapped, or otherwise physically hurt by your partner or ex-partner?: No    Within the last year, have you been raped or forced to have any kind of sexual activity by your partner or ex-partner?: No    Outpatient Medications Prior to Visit  Medication Sig Dispense Refill   hydrOXYzine  (VISTARIL ) 25 MG capsule Take 1 capsule (25 mg total) by mouth every 8 (eight) hours as needed. 30 capsule 2   levocetirizine (XYZAL ) 5 MG tablet Take 1 tablet (5 mg total) by  mouth in the morning and at bedtime. 60 tablet 5   pantoprazole  (PROTONIX ) 20 MG tablet Take 1 tablet (20 mg total) by mouth daily. 30 tablet 3   ibuprofen  (ADVIL ) 800 MG tablet Take 1 tablet (800 mg total) by mouth every 8 (eight) hours as needed. (Patient not taking: Reported on 03/20/2024) 30 tablet 0   RYALTRIS  665-25 MCG/ACT SUSP Place 2 sprays into the nose 2 times daily at 12 noon and 4 pm. (Patient not taking: Reported on 03/20/2024)     No facility-administered medications prior to visit.    No Known Allergies  ROS Review of Systems  Constitutional:  Negative for chills and fever.  Eyes:  Negative for visual disturbance.  Respiratory:  Negative for chest tightness and shortness of breath.   Neurological:  Negative for dizziness and headaches.      Objective:    Physical Exam HENT:     Head: Normocephalic.     Mouth/Throat:     Mouth: Mucous membranes are moist.  Cardiovascular:     Rate and Rhythm: Normal rate.     Heart sounds: Normal heart sounds.  Pulmonary:     Effort: Pulmonary effort is normal.     Breath sounds: Normal breath sounds.  Neurological:     Mental Status: She is alert.     BP 91/65   Pulse 82   Ht 5' 5 (1.651 m)   Wt 137 lb (62.1 kg)   SpO2 96%   BMI 22.80 kg/m  Wt Readings from Last 3 Encounters:  07/20/24 137 lb (62.1 kg)  03/20/24 141 lb 1.9 oz (64 kg)  11/30/23 149 lb 1.9 oz (67.6 kg)    Lab Results  Component Value Date   TSH 1.110 11/10/2023   Lab Results  Component Value Date   WBC 4.1 11/10/2023   HGB 13.5 11/10/2023   HCT 39.1 11/10/2023   MCV 95 11/10/2023   PLT 263 11/10/2023   Lab Results  Component Value Date   NA 137 11/10/2023   K 4.0 11/10/2023   CO2 20 11/10/2023   GLUCOSE 84 11/10/2023   BUN 9 11/10/2023   CREATININE 0.67 11/10/2023   BILITOT 0.8 11/10/2023   ALKPHOS 70 11/10/2023   AST 16 11/10/2023   ALT 11 11/10/2023   PROT 7.8 11/10/2023   ALBUMIN 4.8 11/10/2023   CALCIUM 9.5 11/10/2023    ANIONGAP 8 10/29/2020   EGFR 126 11/10/2023   Lab Results  Component Value Date   CHOL 164 11/10/2023   Lab Results  Component Value Date   HDL 60 11/10/2023   Lab Results  Component  Value Date   LDLCALC 94 11/10/2023   Lab Results  Component Value Date   TRIG 45 11/10/2023   Lab Results  Component Value Date   CHOLHDL 2.7 11/10/2023   Lab Results  Component Value Date   HGBA1C 5.1 11/10/2023      Assessment & Plan:  Muscle strain Assessment & Plan: The patient complains of neck muscle pain, likely related to sleeping position. No recent injury or trauma reported. No red flag symptoms noted. Will initiate treatment with Flexeril  5 mg TID as needed. Patient educated on proper sleep positioning, use of supportive pillows, stretching, and non-pharmacological measures such as warm compresses.    Orders: -     Cyclobenzaprine  HCl; Take 1 tablet (5 mg total) by mouth 3 (three) times daily as needed for muscle spasms.  Dispense: 30 tablet; Refill: 1  Gastroesophageal reflux disease without esophagitis Assessment & Plan: Symptoms likely related to gastric irritation Will refill Protonix  GERD diet encouraged     Orders: -     Pantoprazole  Sodium; Take 1 tablet (20 mg total) by mouth daily.  Dispense: 30 tablet; Refill: 3  IFG (impaired fasting glucose) -     Hemoglobin A1c  Vitamin D  deficiency -     VITAMIN D  25 Hydroxy (Vit-D Deficiency, Fractures)  TSH (thyroid-stimulating hormone deficiency) -     TSH + free T4  Other hyperlipidemia -     Lipid panel -     CMP14+EGFR -     CBC with Differential/Platelet  STD (female) -     Chlamydia/GC NAA, Confirmation   Note: This chart has been completed using [employer]) software, and while attempts have been made to ensure accuracy, certain words and phrases may not be transcribed as intended.   Follow-up: Return in about 6 months (around 01/20/2025).   Myrtis Maille, FNP

## 2024-07-20 NOTE — Patient Instructions (Addendum)
 I appreciate the opportunity to provide care to you today!    Follow up:  6 months  Fasting Labs: please stop by the lab during the week to get your blood drawn (CBC, CMP, TSH, Lipid profile, HgA1c, Vit D)  Pharmacologic Treatment: -Start Flexeril  (cyclobenzaprine ) 5 mg by mouth three times a day (TID) as needed for muscle spasm. -Take Ibuprofen  [dose based on patient's age/weight - e.g., 400-600 mg every 6-8 hours] with food as needed for pain and inflammation. -Caution with drowsiness from Flexeril ; avoid operating machinery or driving.  Nonpharmacological Interventions: -Rest the affected muscle group to prevent further strain, but avoid complete inactivity. -Ice the area for 15-20 minutes every 2-3 hours during the first 48-72 hours to reduce inflammation. -Heat therapy (e.g., warm compress or heating pad) can be applied after the first 72 hours to promote muscle relaxation. -Gentle stretching and range-of-motion exercises as tolerated to maintain flexibility and prevent stiffness. -Activity modification: Avoid heavy lifting or strenuous activity until fully recovered.   Attached with your AVS, you will find valuable resources for self-education. I highly recommend dedicating some time to thoroughly examine them.   Please continue to a heart-healthy diet and increase your physical activities. Try to exercise for at least five days a week.    It was a pleasure to see you and I look forward to continuing to work together on your health and well-being. Please do not hesitate to call the office if you need care or have questions about your care.  In case of emergency, please visit the Emergency Department for urgent care, or contact our clinic at [phone] to schedule an appointment. We're here to help you!   Have a wonderful day and week. With Gratitude, Caysen Whang MSN, FNP-BC

## 2024-07-22 NOTE — Assessment & Plan Note (Signed)
 The patient complains of neck muscle pain, likely related to sleeping position. No recent injury or trauma reported. No red flag symptoms noted. Will initiate treatment with Flexeril  5 mg TID as needed. Patient educated on proper sleep positioning, use of supportive pillows, stretching, and non-pharmacological measures such as warm compresses.

## 2024-08-04 ENCOUNTER — Other Ambulatory Visit: Payer: Self-pay | Admitting: Family Medicine

## 2024-08-04 ENCOUNTER — Encounter: Payer: Self-pay | Admitting: Family Medicine

## 2024-08-04 DIAGNOSIS — M542 Cervicalgia: Secondary | ICD-10-CM

## 2024-08-04 NOTE — Telephone Encounter (Signed)
 Kindly inform the patient that a referral has been placed to orthopedic surgery and orders have been placed for an X-ray of the cervical spine to assess for any abnormalities.

## 2024-08-13 ENCOUNTER — Encounter: Payer: Self-pay | Admitting: Family Medicine

## 2024-08-23 ENCOUNTER — Ambulatory Visit (INDEPENDENT_AMBULATORY_CARE_PROVIDER_SITE_OTHER): Admitting: Orthopedic Surgery

## 2024-08-23 ENCOUNTER — Other Ambulatory Visit: Payer: Self-pay

## 2024-08-23 ENCOUNTER — Encounter: Payer: Self-pay | Admitting: Orthopedic Surgery

## 2024-08-23 VITALS — BP 111/76 | HR 99 | Ht 65.0 in | Wt 142.0 lb

## 2024-08-23 DIAGNOSIS — M542 Cervicalgia: Secondary | ICD-10-CM | POA: Diagnosis not present

## 2024-08-23 NOTE — Patient Instructions (Signed)
 Cervical Strain and Sprain Rehab You have pain and stiffness in your neck.  The muscles around your neck are irritated.  Recommend using heat (heating pad, or hot water in the shower) to warm up the affected muscles.  Then proceed with stretching and strengthening.  Do not stretch until it hurts, but you should feel a pull.  With each exercise, you should be able to stretch a little bit further.  Attempting these exercises daily, or on a regular basis, can improve your symptoms over time.  Do not expect immediate, sustained improvement.  But, it will make your symptoms better over time.   Ask your health care provider which exercises are safe for you. Do exercises exactly as told by your health care provider and adjust them as directed. It is normal to feel mild stretching, pulling, tightness, or discomfort as you do these exercises. Stop right away if you feel sudden pain or your pain gets worse. Do not begin these exercises until told by your health care provider. Stretching and range-of-motion exercises Cervical side bending  Using good posture, sit on a stable chair or stand up. Without moving your shoulders, slowly tilt your left / right ear to your shoulder until you feel a stretch in the opposite side neck muscles. You should be looking straight ahead. Hold for 10 seconds. Repeat with the other side of your neck. Repeat 10 times. Complete this exercise 1-2 times a day. Cervical rotation  Using good posture, sit on a stable chair or stand up. Slowly turn your head to the side as if you are looking over your left / right shoulder. Keep your eyes level with the ground. Stop when you feel a stretch along the side and the back of your neck. Hold for 10 seconds. Repeat this by turning to your other side. Repeat 10 times. Complete this exercise 1-2 times a day. Thoracic extension and pectoral stretch Roll a towel or a small blanket so it is about 4 inches (10 cm) in diameter. Lie down on  your back on a firm surface. Put the towel lengthwise, under your spine in the middle of your back. It should not be under your shoulder blades. The towel should line up with your spine from your middle back to your lower back. Put your hands behind your head and let your elbows fall out to your sides. Hold for 10 seconds. Repeat 10 times. Complete this exercise 1-2 times a day. Strengthening exercises Isometric upper cervical flexion Lie on your back with a thin pillow behind your head and a small rolled-up towel under your neck. Gently tuck your chin toward your chest and nod your head down to look toward your feet. Do not lift your head off the pillow. Hold for 10 seconds. Release the tension slowly. Relax your neck muscles completely before you repeat this exercise. Repeat 10 times. Complete this exercise 1-2 times a day. Isometric cervical extension  Stand about 6 inches (15 cm) away from a wall, with your back facing the wall. Place a soft object, about 6-8 inches (15-20 cm) in diameter, between the back of your head and the wall. A soft object could be a small pillow, a ball, or a folded towel. Gently tilt your head back and press into the soft object. Keep your jaw and forehead relaxed. Hold for 10 seconds. Release the tension slowly. Relax your neck muscles completely before you repeat this exercise. Repeat 10 times. Complete this exercise 1-2 times a day. Posture  and body mechanics Body mechanics refers to the movements and positions of your body while you do your daily activities. Posture is part of body mechanics. Good posture and healthy body mechanics can help to relieve stress in your body's tissues and joints. Good posture means that your spine is in its natural S-curve position (your spine is neutral), your shoulders are pulled back slightly, and your head is not tipped forward. The following are general guidelines for applying improved posture and body mechanics to your  everyday activities. Sitting  When sitting, keep your spine neutral and keep your feet flat on the floor. Use a footrest, if necessary, and keep your thighs parallel to the floor. Avoid rounding your shoulders, and avoid tilting your head forward. When working at a desk or a computer, keep your desk at a height where your hands are slightly lower than your elbows. Slide your chair under your desk so you are close enough to maintain good posture. When working at a computer, place your monitor at a height where you are looking straight ahead and you do not have to tilt your head forward or downward to look at the screen. Standing  When standing, keep your spine neutral and keep your feet about hip-width apart. Keep a slight bend in your knees. Your ears, shoulders, and hips should line up. When you do a task in which you stand in one place for a long time, place one foot up on a stable object that is 2-4 inches (5-10 cm) high, such as a footstool. This helps keep your spine neutral. Resting When lying down and resting, avoid positions that are most painful for you. Try to support your neck in a neutral position. You can use a contour pillow or a small rolled-up towel. Your pillow should support your neck but not push on it. This information is not intended to replace advice given to you by your health care provider. Make sure you discuss any questions you have with your health care provider. Document Revised: 03/01/2019 Document Reviewed: 08/10/2018 Elsevier Patient Education  2022 [employer].

## 2024-08-24 ENCOUNTER — Other Ambulatory Visit: Payer: Self-pay

## 2024-08-24 DIAGNOSIS — R109 Unspecified abdominal pain: Secondary | ICD-10-CM

## 2024-08-24 NOTE — Telephone Encounter (Signed)
 Referral placed.

## 2024-08-24 NOTE — Progress Notes (Signed)
 New Patient Visit  Assessment: Janet Lynch is a 23 y.o. female with the following: 1. Neck pain  Plan: Janet Lynch has some pain in her neck, as well as the upper back.  She is doing much better overall.  She has some radiating symptoms into the left arm.  Insidious onset.  Radiographs are normal.  At this point, she is improving.  This most likely represents some muscle spasm type pain.  Low concern for a herniated disc or nerve compression based on her physical exam.  This remains a possibility.  Continue with medications as needed.  Ice or heat can help with her symptoms.  She needs to pay attention to her posture when she is doing work for school.  I provided her with some exercises.  She should consider physical therapy, if she is not improving.  If her symptoms worsen, she will return to clinic.  Otherwise, anticipate continued improvement.  Follow-up: Return if symptoms worsen or fail to improve.  Subjective:  Chief Complaint  Patient presents with   Neck Pain    Going into the shoulders and into the mid back. Pt states pain was the mos severe mid Sep '25 but the last wk has been better. States she was given muscle relaxers that didn't help at all so she stopped taking them. She is still have some pain. Has also seen a chiropractor for pain    History of Present Illness: Janet Lynch is a 23 y.o. female who has been referred by Janet Gerlach, FNP for evaluation of neck pain.  She states that she has had severe neck pain for about a month.  No specific injury.  She states that she woke up 1 morning and started to have pain.  Pain started in the left side of her neck.  Occasional radiating pains in the left arm.  She also has pain radiating into her upper back.  Pain was most severe in the middle of September.  Since then, the pain has started to improve.  She is denying numbness and tingling.  She was provided muscle relaxers, without improvement in her symptoms.   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    Past Surgical History:  Procedure Laterality Date   KNEE SURGERY Left    TYMPANOPLASTY  11/21/2012   Procedure: TYMPANOPLASTY;  Surgeon: Ana LELON Moccasin, Lynch;  Location: [facility];  Service: ENT;  Laterality: Right;  with fascia graft    TYMPANOSTOMY TUBE PLACEMENT  age 13    Family History  Problem Relation Age of Onset   Cancer Other    Heart disease Other    Asthma Cousin    Eczema Cousin    Social History   Tobacco Use   Smoking status: Never    Passive exposure: Current   Smokeless tobacco: Never  Vaping Use   Vaping status: Never Used  Substance Use Topics   Alcohol use: Yes    Comment: occ   Drug use: No    No Known Allergies  No outpatient medications have been marked as taking for the 08/23/24 encounter (Office Visit) with Janet Lynch.  Objective: BP 111/76   Pulse 99   Ht 5' 5 (1.651 m)   Wt 142 lb (64.4 kg)   BMI 23.63 kg/m   Physical Exam:  General: Alert and oriented. and No acute distress. Gait: Normal gait.  Slightly restricted range of motion of the neck.  She notes slight worsening of her symptoms with neck flexion.  She has some tenderness within the trapezius bilaterally.  Tenderness in the upper back.  Full range of motion of bilateral shoulders.  Excellent upper body strength.  No numbness or tingling.  IMAGING: I personally ordered and reviewed the following images   X-rays of the cervical spine were obtained in clinic today.  No comparisons are available.  No acute injuries.  Well-maintained disc height.  Normal curvature.  No anterolisthesis.  No bony lesions.  Impression: Normal cervical spine  x-rays   New Medications:  No orders of the defined types were placed in this encounter.     Janet DELENA Horde, Lynch  08/24/2024 9:23 AM

## 2024-09-15 ENCOUNTER — Encounter: Payer: Self-pay | Admitting: Family Medicine

## 2024-11-09 ENCOUNTER — Other Ambulatory Visit: Payer: Self-pay | Admitting: Family Medicine

## 2024-11-09 DIAGNOSIS — L720 Epidermal cyst: Secondary | ICD-10-CM

## 2024-11-09 MED ORDER — MUPIROCIN 2 % EX OINT: 1.0000 | TOPICAL_OINTMENT | Freq: Two times a day (BID) | CUTANEOUS | 0 refills | Status: AC

## 2024-11-09 NOTE — Telephone Encounter (Signed)
 I recommend an in-person evaluation; however, in the meantime, please advise the patient to avoid squeezing, picking, or attempting to drain the area, as this may worsen infection or delay healing. The area should be cleaned gently once daily with mild soap and water and kept clean and dry. Warm compresses may be applied for 10-15 minutes, 2-3 times daily to help reduce inflammation and promote natural drainage. The patient should avoid tight clothing or friction over the affected area, and over-the-counter pain relievers may be used as needed for discomfort if not contraindicated. The patient should seek urgent care or contact the clinic promptly if there is increasing redness, swelling, warmth, pain, or enlargement of the lesion, and seek immediate care if fever, chills, red streaking, or spreading redness develops. If the lesion does not improve or resolve within 7-10 days, or if it continues to recur, further evaluation is recommended.

## 2024-11-15 ENCOUNTER — Encounter (INDEPENDENT_AMBULATORY_CARE_PROVIDER_SITE_OTHER): Payer: Self-pay | Admitting: *Deleted

## 2024-11-29 ENCOUNTER — Telehealth: Payer: Self-pay

## 2024-11-29 NOTE — Telephone Encounter (Signed)
 Copied from CRM [id number]. Topic: Appointments - Scheduling Inquiry for Clinic >> Nov 29, 2024  3:51 PM Tobias CROME wrote: Reason for CRM: Patient inquiring if she can get tested for any STD or STI's. Patient has a new partner, would like get tested within next couple of weeks.   Requesting callback, [phone]

## 2024-11-30 ENCOUNTER — Other Ambulatory Visit: Payer: Self-pay | Admitting: Family Medicine

## 2024-11-30 DIAGNOSIS — A64 Unspecified sexually transmitted disease: Secondary | ICD-10-CM

## 2024-12-01 NOTE — Telephone Encounter (Signed)
 Message sent to patient.

## 2024-12-02 ENCOUNTER — Ambulatory Visit: Payer: Self-pay | Admitting: Family Medicine

## 2024-12-02 LAB — CMP14+EGFR
ALT: 9 IU/L (ref 0–32)
AST: 12 IU/L (ref 0–40)
Albumin: 4.7 g/dL (ref 4.0–5.0)
Alkaline Phosphatase: 64 IU/L (ref 41–116)
BUN/Creatinine Ratio: 12 (ref 9–23)
BUN: 8 mg/dL (ref 6–20)
Bilirubin Total: 0.5 mg/dL (ref 0.0–1.2)
CO2: 22 mmol/L (ref 20–29)
Calcium: 9.6 mg/dL (ref 8.7–10.2)
Chloride: 102 mmol/L (ref 96–106)
Creatinine, Ser: 0.65 mg/dL (ref 0.57–1.00)
Globulin, Total: 3 g/dL (ref 1.5–4.5)
Glucose: 60 mg/dL — ABNORMAL LOW (ref 70–99)
Potassium: 3.8 mmol/L (ref 3.5–5.2)
Sodium: 138 mmol/L (ref 134–144)
Total Protein: 7.7 g/dL (ref 6.0–8.5)
eGFR: 126 mL/min/1.73

## 2024-12-02 LAB — CBC WITH DIFFERENTIAL/PLATELET
Basophils Absolute: 0 x10E3/uL (ref 0.0–0.2)
Basos: 1 %
EOS (ABSOLUTE): 0.1 x10E3/uL (ref 0.0–0.4)
Eos: 2 %
Hematocrit: 39.4 % (ref 34.0–46.6)
Hemoglobin: 13.1 g/dL (ref 11.1–15.9)
Immature Grans (Abs): 0 x10E3/uL (ref 0.0–0.1)
Immature Granulocytes: 0 %
Lymphocytes Absolute: 1.9 x10E3/uL (ref 0.7–3.1)
Lymphs: 45 %
MCH: 32.4 pg (ref 26.6–33.0)
MCHC: 33.2 g/dL (ref 31.5–35.7)
MCV: 98 fL — ABNORMAL HIGH (ref 79–97)
Monocytes Absolute: 0.5 x10E3/uL (ref 0.1–0.9)
Monocytes: 11 %
Neutrophils Absolute: 1.7 x10E3/uL (ref 1.4–7.0)
Neutrophils: 41 %
Platelets: 279 x10E3/uL (ref 150–450)
RBC: 4.04 x10E6/uL (ref 3.77–5.28)
RDW: 12.5 % (ref 11.7–15.4)
WBC: 4.2 x10E3/uL (ref 3.4–10.8)

## 2024-12-02 LAB — LIPID PANEL
Chol/HDL Ratio: 2.4 ratio (ref 0.0–4.4)
Cholesterol, Total: 153 mg/dL (ref 100–199)
HDL: 63 mg/dL
LDL Chol Calc (NIH): 79 mg/dL (ref 0–99)
Triglycerides: 52 mg/dL (ref 0–149)
VLDL Cholesterol Cal: 11 mg/dL (ref 5–40)

## 2024-12-02 LAB — HEMOGLOBIN A1C
Est. average glucose Bld gHb Est-mCnc: 88 mg/dL
Hgb A1c MFr Bld: 4.7 % — ABNORMAL LOW (ref 4.8–5.6)

## 2024-12-02 LAB — TSH+FREE T4
Free T4: 1.29 ng/dL (ref 0.82–1.77)
TSH: 0.864 u[IU]/mL (ref 0.450–4.500)

## 2024-12-02 LAB — VITAMIN D 25 HYDROXY (VIT D DEFICIENCY, FRACTURES): Vit D, 25-Hydroxy: 95 ng/mL (ref 30.0–100.0)

## 2024-12-04 ENCOUNTER — Ambulatory Visit: Payer: Self-pay | Admitting: Family Medicine

## 2024-12-04 LAB — NUSWAB VAGINITIS PLUS (VG+)
Candida albicans, NAA: NEGATIVE
Candida glabrata, NAA: NEGATIVE
Chlamydia trachomatis, NAA: NEGATIVE
Neisseria gonorrhoeae, NAA: NEGATIVE
Trich vag by NAA: NEGATIVE

## 2024-12-05 ENCOUNTER — Other Ambulatory Visit: Payer: Self-pay | Admitting: Family Medicine

## 2024-12-05 DIAGNOSIS — A64 Unspecified sexually transmitted disease: Secondary | ICD-10-CM

## 2024-12-05 LAB — CHLAMYDIA/GC NAA, CONFIRMATION
Chlamydia trachomatis, NAA: NEGATIVE
Neisseria gonorrhoeae, NAA: NEGATIVE

## 2024-12-05 NOTE — Telephone Encounter (Signed)
 Orders have been placed for HIV and HSV testing

## 2024-12-06 NOTE — Progress Notes (Signed)
 Please inform the patient how gonorrhea chlamydia screening was negative. All other labs are stable

## 2025-01-17 ENCOUNTER — Ambulatory Visit: Admitting: Nurse Practitioner
